# Patient Record
Sex: Female | Born: 1965 | Race: White | Hispanic: No | Smoking: Never smoker
Health system: Southern US, Community
[De-identification: ages and names within clinical notes are randomized; demographics above are authoritative.]

## PROBLEM LIST (undated history)

## (undated) DIAGNOSIS — E785 Hyperlipidemia, unspecified: Secondary | ICD-10-CM

## (undated) DIAGNOSIS — I5020 Unspecified systolic (congestive) heart failure: Secondary | ICD-10-CM

## (undated) DIAGNOSIS — I119 Hypertensive heart disease without heart failure: Secondary | ICD-10-CM

## (undated) DIAGNOSIS — I428 Other cardiomyopathies: Secondary | ICD-10-CM

## (undated) HISTORY — DX: Unspecified systolic (congestive) heart failure: I50.20

## (undated) HISTORY — DX: Hypertensive heart disease without heart failure: I11.9

## (undated) HISTORY — DX: Hyperlipidemia, unspecified: E78.5

## (undated) HISTORY — DX: Other cardiomyopathies: I42.8

---

## 2006-05-16 ENCOUNTER — Ambulatory Visit: Payer: Self-pay | Admitting: Obstetrics and Gynecology

## 2007-09-11 ENCOUNTER — Ambulatory Visit: Payer: Self-pay | Admitting: Obstetrics and Gynecology

## 2019-02-19 ENCOUNTER — Emergency Department: Payer: BC Managed Care – PPO

## 2019-02-19 ENCOUNTER — Emergency Department
Admission: EM | Admit: 2019-02-19 | Discharge: 2019-02-19 | Disposition: A | Discharge status: Home / Self Care | Payer: BC Managed Care – PPO | Attending: Emergency Medicine | Admitting: Emergency Medicine

## 2019-02-19 ENCOUNTER — Encounter: Payer: Self-pay | Admitting: Emergency Medicine

## 2019-02-19 DIAGNOSIS — Z20828 Contact with and (suspected) exposure to other viral communicable diseases: Secondary | ICD-10-CM | POA: Diagnosis not present

## 2019-02-19 DIAGNOSIS — R030 Elevated blood-pressure reading, without diagnosis of hypertension: Secondary | ICD-10-CM | POA: Diagnosis not present

## 2019-02-19 DIAGNOSIS — R52 Pain, unspecified: Secondary | ICD-10-CM

## 2019-02-19 DIAGNOSIS — R609 Edema, unspecified: Secondary | ICD-10-CM

## 2019-02-19 DIAGNOSIS — I10 Essential (primary) hypertension: Secondary | ICD-10-CM | POA: Diagnosis not present

## 2019-02-19 DIAGNOSIS — I161 Hypertensive emergency: Secondary | ICD-10-CM | POA: Diagnosis not present

## 2019-02-19 DIAGNOSIS — R600 Localized edema: Secondary | ICD-10-CM | POA: Insufficient documentation

## 2019-02-19 LAB — BASIC METABOLIC PANEL
Anion gap: 14 (ref 5–15)
BUN: 19 mg/dL (ref 6–20)
CO2: 22 mmol/L (ref 22–32)
Calcium: 9.5 mg/dL (ref 8.9–10.3)
Chloride: 99 mmol/L (ref 98–111)
Creatinine, Ser: 1.1 mg/dL — ABNORMAL HIGH (ref 0.44–1.00)
GFR calc Af Amer: >60 mL/min (ref >60)
GFR calc non Af Amer: 58 mL/min — ABNORMAL LOW (ref >60)
Glucose, Bld: 99 mg/dL (ref 70–99)
Potassium: 3.1 mmol/L — ABNORMAL LOW (ref 3.5–5.1)
Sodium: 135 mmol/L (ref 135–145)

## 2019-02-19 LAB — CBC
HCT: 39.9 % (ref 36.0–46.0)
Hemoglobin: 11.7 g/dL — ABNORMAL LOW (ref 12.0–15.0)
MCH: 21.9 pg — ABNORMAL LOW (ref 26.0–34.0)
MCHC: 29.3 g/dL — ABNORMAL LOW (ref 30.0–36.0)
MCV: 74.6 fL — ABNORMAL LOW (ref 80.0–100.0)
Platelets: 380 10*3/uL (ref 150–400)
RBC: 5.35 MIL/uL — ABNORMAL HIGH (ref 3.87–5.11)
RDW: 18.7 % — ABNORMAL HIGH (ref 11.5–15.5)
WBC: 9.7 10*3/uL (ref 4.0–10.5)
nRBC: 0 % (ref 0.0–0.2)

## 2019-02-19 LAB — TROPONIN I (HIGH SENSITIVITY)
Troponin I (High Sensitivity): 26 ng/L — ABNORMAL HIGH (ref <18)
Troponin I (High Sensitivity): 25 ng/L — ABNORMAL HIGH (ref <18)

## 2019-02-19 MED ORDER — LORAZEPAM 1 MG PO TABS
1.0000 mg | ORAL_TABLET | Freq: Once | ORAL | Status: AC
  Administered 2019-02-19: 1 mg via ORAL
  Filled 2019-02-19: qty 1

## 2019-02-19 MED ORDER — AMLODIPINE BESYLATE 10 MG PO TABS: 10.0000 mg | ORAL_TABLET | Freq: Every day | ORAL | 2 refills | Status: DC

## 2019-02-19 MED ORDER — LABETALOL HCL 5 MG/ML IV SOLN
10.0000 mg | Freq: Once | INTRAVENOUS | Status: AC
  Administered 2019-02-19: 10 mg via INTRAVENOUS
  Filled 2019-02-19: qty 4

## 2019-02-19 MED ORDER — FUROSEMIDE 20 MG PO TABS: 20.0000 mg | ORAL_TABLET | Freq: Every day | ORAL | 0 refills | Status: DC

## 2019-02-19 MED ORDER — CLONIDINE HCL 0.1 MG PO TABS
0.1000 mg | ORAL_TABLET | Freq: Once | ORAL | Status: AC
  Administered 2019-02-19: 0.1 mg via ORAL
  Filled 2019-02-19: qty 1

## 2019-02-19 NOTE — ED Provider Notes (Signed)
[facility] Emergency Department Provider Note  Time seen: 10:23 AM  I have reviewed the triage vital signs and the nursing notes.   HISTORY  Chief Complaint Hypertension   HPI Rebecca Bennett is a 53 y.o. female with no past medical history takes no medications presents to the emergency department for hypertension.  According to the patient she was seen in urgent care today for a Covid test.  States her father tested positive over the weekend and they were around him recently.  Patient denies any symptoms denies any cough congestion fever headache.  However while the patient was at urgent care getting her Covid swab they did vitals and noted the patient be quite hypertensive and sent the patient to the emergency department.  Here the patient is hypertensive 209/143.  More concerning the patient has lower extremity edema which she states has been occurring over the past 1 to 2 weeks but has never had lower extremity murmur previously per patient.  States minimal shortness of breath with exertion but denies any increase over the past couple weeks.  Denies any chest pain at any point.   History reviewed. No pertinent past medical history.  There are no active problems to display for this patient.   History reviewed. No pertinent surgical history.  Prior to Admission medications   Not on File    No Known Allergies  No family history on file.  Social History Social History   Tobacco Use  . Smoking status: Never Smoker  Substance Use Topics  . Alcohol use: Not Currently  . Drug use: Not on file    Review of Systems Constitutional: Negative for fever. Cardiovascular: Negative for chest pain. Respiratory: Mild shortness of breath with exertion, none at rest. Gastrointestinal: Negative for abdominal pain, vomiting and diarrhea. Genitourinary: Negative for urinary compaints Musculoskeletal: Lower extreme edema x2 weeks. Skin: Negative for skin  complaints  Neurological: Negative for headache All other ROS negative  ____________________________________________   PHYSICAL EXAM:  VITAL SIGNS: ED Triage Vitals [02/19/19 0955]  Enc Vitals Group     BP (!) 209/143     Pulse Rate (!) 120     Resp 18     Temp 98 F (36.7 C)     Temp Source Oral     SpO2 100 %     Weight 184 lb (83.5 kg)     Height 5\' 2"  (1.575 m)     Head Circumference      Peak Flow      Pain Score 0     Pain Loc      Pain Edu?      Excl. in GC?    Constitutional: Alert and oriented. Well appearing and in no distress. Eyes: Normal exam ENT      Head: Normocephalic and atraumatic      Mouth/Throat: Mucous membranes are moist. Cardiovascular: Regular rhythm rate around 120.  No murmur. Respiratory: Normal respiratory effort without tachypnea nor retractions. Breath sounds are clear  Gastrointestinal: Soft and nontender. No distention.  Musculoskeletal: Nontender with normal range of motion in all extremities.  2+ lower extreme edema bilaterally.  Nontender.  Pitting. Neurologic:  Normal speech and language. No gross focal neurologic deficits  Skin:  Skin is warm, dry and intact.    ____________________________________________    EKG  EKG viewed and interpreted by myself shows sinus tachycardia at 127 with a narrow QRS, left axis deviation, largely normal intervals, nonspecific ST changes.  ____________________________________________  RADIOLOGY  Chest x-ray is negative for acute abnormality, besides mild cardiomegaly.  ____________________________________________   INITIAL IMPRESSION / ASSESSMENT AND PLAN / ED COURSE  Pertinent labs & imaging results that were available during my care of the patient were reviewed by me and considered in my medical decision making (see chart for details).   Patient presents to the emergency department for hypertension referred from urgent care.  Patient does have 2+ lower extremity edema to bilateral  lower extremities which is new x2 weeks per patient.  Denies any chest pain but does state some mild shortness of breath with exertion.  Patient is quite hypertensive, differential would include hypertensive urgency/emergency, increased afterload leading to peripheral edema, ACS or CHF.  We will check labs including cardiac enzymes, chest x-ray and continue to closely monitor.  We will dose clonidine 0.1 mg in the emergency department and monitor for improvement.  Patient is tachycardic but states she feels very anxious/nervous currently.  Patient's repeat troponin is largely unchanged.  Patient's blood pressure has responded very well to medication currently 135/95.  We will discharge patient on a short course of Lasix, we will start the patient on antihypertensive medications.  We will have the patient follow-up with cardiology tomorrow to arrange an appointment for an echocardiogram.  I discussed very strict return precautions as well as home monitoring of her blood pressure.  Patient is agreeable to this plan of care.  Rebecca Bennett was evaluated in Emergency Department on 02/19/2019 for the symptoms described in the history of present illness. She was evaluated in the context of the global COVID-19 pandemic, which necessitated consideration that the patient might be at risk for infection with the SARS-CoV-2 virus that causes COVID-19. Institutional protocols and algorithms that pertain to the evaluation of patients at risk for COVID-19 are in a state of rapid change based on information released by regulatory bodies including the CDC and federal and state organizations. These policies and algorithms were followed during the patient's care in the ED.  ____________________________________________   FINAL CLINICAL IMPRESSION(S) / ED DIAGNOSES  Hypertension Peripheral edema   Harvest Dark, MD 02/19/19 1420

## 2019-02-19 NOTE — ED Notes (Signed)
EDP at bedside  

## 2019-02-19 NOTE — ED Triage Notes (Addendum)
Reports she went to [facility] for asymptomatic covid screening-her father dx. She was told BP high and referred to ER. Does not take medications, denies CP or SOB. States BP has been high for a few years-does not have PCP Bilateral foot and leg swelling X 3-4 days.  Pt alert and oriented X4, cooperative, RR even and unlabored, color WNL. Pt in NAD.

## 2019-02-24 ENCOUNTER — Other Ambulatory Visit: Payer: Self-pay

## 2019-02-24 DIAGNOSIS — Z20822 Contact with and (suspected) exposure to covid-19: Secondary | ICD-10-CM

## 2019-02-26 LAB — NOVEL CORONAVIRUS, NAA: SARS-CoV-2, NAA: NOT DETECTED

## 2019-03-24 ENCOUNTER — Encounter: Payer: Self-pay | Admitting: Internal Medicine

## 2019-03-24 ENCOUNTER — Ambulatory Visit (INDEPENDENT_AMBULATORY_CARE_PROVIDER_SITE_OTHER): Payer: BC Managed Care – PPO | Admitting: Internal Medicine

## 2019-03-24 ENCOUNTER — Other Ambulatory Visit: Payer: Self-pay

## 2019-03-24 VITALS — BP 140/82 | HR 123 | Temp 97.1°F | Ht 61.0 in | Wt 171.0 lb

## 2019-03-24 DIAGNOSIS — I10 Essential (primary) hypertension: Secondary | ICD-10-CM | POA: Diagnosis not present

## 2019-03-24 DIAGNOSIS — E7800 Pure hypercholesterolemia, unspecified: Secondary | ICD-10-CM | POA: Diagnosis not present

## 2019-03-24 DIAGNOSIS — R609 Edema, unspecified: Secondary | ICD-10-CM | POA: Diagnosis not present

## 2019-03-24 DIAGNOSIS — Z13220 Encounter for screening for lipoid disorders: Secondary | ICD-10-CM | POA: Diagnosis not present

## 2019-03-24 LAB — COMPREHENSIVE METABOLIC PANEL
ALT: 13 U/L (ref 0–35)
AST: 14 U/L (ref 0–37)
Albumin: 4.5 g/dL (ref 3.5–5.2)
Alkaline Phosphatase: 126 U/L — ABNORMAL HIGH (ref 39–117)
BUN: 7 mg/dL (ref 6–23)
CO2: 27 mEq/L (ref 19–32)
Calcium: 9.7 mg/dL (ref 8.4–10.5)
Chloride: 102 mEq/L (ref 96–112)
Creatinine, Ser: 0.75 mg/dL (ref 0.40–1.20)
GFR: 81.1 mL/min (ref >60.00)
Glucose, Bld: 103 mg/dL — ABNORMAL HIGH (ref 70–99)
Potassium: 4 mEq/L (ref 3.5–5.1)
Sodium: 138 mEq/L (ref 135–145)
Total Bilirubin: 1 mg/dL (ref 0.2–1.2)
Total Protein: 6.9 g/dL (ref 6.0–8.3)

## 2019-03-24 LAB — LIPID PANEL
Cholesterol: 209 mg/dL — ABNORMAL HIGH (ref 0–200)
HDL: 33.7 mg/dL — ABNORMAL LOW (ref >39.00)
LDL Cholesterol: 151 mg/dL — ABNORMAL HIGH (ref 0–99)
NonHDL: 175.78
Total CHOL/HDL Ratio: 6
Triglycerides: 126 mg/dL (ref 0.0–149.0)
VLDL: 25.2 mg/dL (ref 0.0–40.0)

## 2019-03-24 LAB — CBC
HCT: 37.8 % (ref 36.0–46.0)
Hemoglobin: 11.6 g/dL — ABNORMAL LOW (ref 12.0–15.0)
MCHC: 30.7 g/dL (ref 30.0–36.0)
MCV: 74.3 fl — ABNORMAL LOW (ref 78.0–100.0)
Platelets: 327 10*3/uL (ref 150.0–400.0)
RBC: 5.09 Mil/uL (ref 3.87–5.11)
RDW: 23.4 % — ABNORMAL HIGH (ref 11.5–15.5)
WBC: 7.2 10*3/uL (ref 4.0–10.5)

## 2019-03-24 LAB — BRAIN NATRIURETIC PEPTIDE: Pro B Natriuretic peptide (BNP): 202 pg/mL — ABNORMAL HIGH (ref 0.0–100.0)

## 2019-03-24 MED ORDER — FUROSEMIDE 20 MG PO TABS: 20.0000 mg | ORAL_TABLET | Freq: Every day | ORAL | 1 refills | Status: DC

## 2019-03-24 NOTE — Progress Notes (Signed)
HPI  Pt presents to the clinic today to establish care and for management of the conditions listed below. She has not had a PCP in a while but does follow with GYN.  HTN: Her BP today is 140/82. She is taking Amlodipine as prescribed, and having some swelling in both her feet. ECG from 01/2019 reviewed.  Flu: never Tetanus: > 10 years ago Pap Smear: > 5 years ago Mammogram: > 5 years ago Colon Screening: never Vision Screening: as needed Dentist: as needed  No past medical history on file.  Current Outpatient Medications  Medication Sig Dispense Refill  . acetaminophen (TYLENOL) 500 MG tablet Take 500 mg by mouth every 6 (six) hours as needed.    Marland Kitchen amLODipine (NORVASC) 10 MG tablet Take 1 tablet (10 mg total) by mouth daily. 30 tablet 2  . furosemide (LASIX) 20 MG tablet Take 1 tablet (20 mg total) by mouth daily for 5 days. (Patient not taking: Reported on 03/24/2019) 5 tablet 0   No current facility-administered medications for this visit.    No Known Allergies  Family History  Problem Relation Age of Onset  . Hypertension Mother   . Breast cancer Mother   . Hyperlipidemia Mother   . Prostate cancer Father   . Hypertension Maternal Grandmother   . Breast cancer Maternal Grandmother   . Hyperlipidemia Maternal Grandmother     Social History   Socioeconomic History  . Marital status: Widowed    Spouse name: Not on file  . Number of children: Not on file  . Years of education: Not on file  . Highest education level: Not on file  Occupational History  . Not on file  Tobacco Use  . Smoking status: Never Smoker  . Smokeless tobacco: Never Used  Substance and Sexual Activity  . Alcohol use: Yes    Comment: occasional  . Drug use: Not on file  . Sexual activity: Not on file  Other Topics Concern  . Not on file  Social History Narrative  . Not on file   Social Determinants of Health   Financial Resource Strain:   . Difficulty of Paying Living Expenses: Not on  file  Food Insecurity:   . Worried About Programme researcher, broadcasting/film/video in the Last Year: Not on file  . Ran Out of Food in the Last Year: Not on file  Transportation Needs:   . Lack of Transportation (Medical): Not on file  . Lack of Transportation (Non-Medical): Not on file  Physical Activity:   . Days of Exercise per Week: Not on file  . Minutes of Exercise per Session: Not on file  Stress:   . Feeling of Stress : Not on file  Social Connections:   . Frequency of Communication with Friends and Family: Not on file  . Frequency of Social Gatherings with Friends and Family: Not on file  . Attends Religious Services: Not on file  . Active Member of Clubs or Organizations: Not on file  . Attends Banker Meetings: Not on file  . Marital Status: Not on file  Intimate Partner Violence:   . Fear of Current or Ex-Partner: Not on file  . Emotionally Abused: Not on file  . Physically Abused: Not on file  . Sexually Abused: Not on file    ROS:  Constitutional: Denies fever, malaise, fatigue, headache or abrupt weight changes.  HEENT: Denies eye pain, eye redness, ear pain, ringing in the ears, wax buildup, runny nose, nasal congestion, bloody  nose, or sore throat. Respiratory: Denies difficulty breathing, shortness of breath, cough or sputum production.   Cardiovascular: Pt reports swelling in feet. Denies chest pain, chest tightness, palpitations or swelling in the hands.  Gastrointestinal: Denies abdominal pain, bloating, constipation, diarrhea or blood in the stool.  GU: Denies frequency, urgency, pain with urination, blood in urine, odor or discharge. Musculoskeletal: Denies decrease in range of motion, difficulty with gait, muscle pain or joint pain and swelling.  Skin: Denies redness, rashes, lesions or ulcercations.  Neurological: Denies dizziness, difficulty with memory, difficulty with speech or problems with balance and coordination.  Psych: Pt reports intermittent anxiety.  Denies depression, SI/HI.  No other specific complaints in a complete review of systems (except as listed in HPI above).  PE:  BP 140/82   Pulse (!) 123   Temp (!) 97.1 F (36.2 C) (Temporal)   Ht 5\' 1"  (1.549 m)   Wt 171 lb (77.6 kg)   LMP 08/26/2018   SpO2 100%   BMI 32.31 kg/m   Wt Readings from Last 3 Encounters:  02/19/19 184 lb (83.5 kg)    General: Appears her stated age, obese, in NAD. HEENT: Head: normal shape and size; Eyes: sclera white, no icterus, conjunctiva pink, PERRLA and EOMs intact;  Neck: Neck supple, trachea midline. No masses, lumps or thyromegaly present.  Cardiovascular: Tachycardic with normal rhythm. S1,S2 noted.  No murmur, rubs or gallops noted. 1+ pitting BLE edema. No carotid bruits noted. Pulmonary/Chest: Normal effort and positive vesicular breath sounds. No respiratory distress. No wheezes, rales or ronchi noted.  Musculoskeletal: No difficulty with gait.  Neurological: Alert and oriented.  Psychiatric: Mood and affect normal. Behavior is normal. Judgment and thought content normal.     BMET    Component Value Date/Time   NA 135 02/19/2019 1004   K 3.1 (L) 02/19/2019 1004   CL 99 02/19/2019 1004   CO2 22 02/19/2019 1004   GLUCOSE 99 02/19/2019 1004   BUN 19 02/19/2019 1004   CREATININE 1.10 (H) 02/19/2019 1004   CALCIUM 9.5 02/19/2019 1004   GFRNONAA 58 (L) 02/19/2019 1004   GFRAA >60 02/19/2019 1004    Lipid Panel  No results found for: CHOL, TRIG, HDL, CHOLHDL, VLDL, LDLCALC  CBC    Component Value Date/Time   WBC 9.7 02/19/2019 1004   RBC 5.35 (H) 02/19/2019 1004   HGB 11.7 (L) 02/19/2019 1004   HCT 39.9 02/19/2019 1004   PLT 380 02/19/2019 1004   MCV 74.6 (L) 02/19/2019 1004   MCH 21.9 (L) 02/19/2019 1004   MCHC 29.3 (L) 02/19/2019 1004   RDW 18.7 (H) 02/19/2019 1004    Hgb A1C No results found for: HGBA1C   Assessment and Plan:  Peripheral Edema:  CMET and BNP today Discussed DASH diet Encouraged  elevation Consider stopping Amlodipine vs adding diuretic pending labs  Screen for HLD:  CMET and Lipid profile today Encouraged her to consume a low fat diet.  Tachycardia:  Consider adding beta blocker to decrease HR and for better BP control pending labs RTC in 2 weeks for BP followup Webb Silversmith, NP This visit occurred during the SARS-CoV-2 public health emergency.  Safety protocols were in place, including screening questions prior to the visit, additional usage of staff PPE, and extensive cleaning of exam room while observing appropriate contact time as indicated for disinfecting solutions.

## 2019-03-24 NOTE — Patient Instructions (Signed)

## 2019-03-24 NOTE — Addendum Note (Signed)
Addended by: Lurlean Nanny on: 03/24/2019 04:44 PM   Modules accepted: Orders

## 2019-03-24 NOTE — Assessment & Plan Note (Signed)
CMET today Continue Amlodipine for now Consider adding diuretic vs beta blocker pending labs Discussed DASH diet

## 2019-03-24 NOTE — Addendum Note (Signed)
Addended by: [name] on: 03/24/2019 03:28 PM   Modules accepted: Orders

## 2019-04-08 ENCOUNTER — Other Ambulatory Visit: Payer: Self-pay | Admitting: Internal Medicine

## 2019-04-08 DIAGNOSIS — R609 Edema, unspecified: Secondary | ICD-10-CM

## 2019-04-14 ENCOUNTER — Other Ambulatory Visit: Payer: Self-pay

## 2019-04-14 ENCOUNTER — Ambulatory Visit (INDEPENDENT_AMBULATORY_CARE_PROVIDER_SITE_OTHER): Payer: BC Managed Care – PPO

## 2019-04-14 DIAGNOSIS — R609 Edema, unspecified: Secondary | ICD-10-CM | POA: Diagnosis not present

## 2019-04-16 ENCOUNTER — Other Ambulatory Visit: Payer: Self-pay

## 2019-04-16 ENCOUNTER — Ambulatory Visit (INDEPENDENT_AMBULATORY_CARE_PROVIDER_SITE_OTHER): Payer: BC Managed Care – PPO | Admitting: Internal Medicine

## 2019-04-16 ENCOUNTER — Encounter: Payer: Self-pay | Admitting: Internal Medicine

## 2019-04-16 VITALS — BP 142/86 | HR 126 | Temp 97.2°F | Wt 168.0 lb

## 2019-04-16 DIAGNOSIS — I5022 Chronic systolic (congestive) heart failure: Secondary | ICD-10-CM

## 2019-04-16 DIAGNOSIS — I10 Essential (primary) hypertension: Secondary | ICD-10-CM

## 2019-04-16 LAB — BASIC METABOLIC PANEL
BUN: 13 mg/dL (ref 6–23)
CO2: 28 mEq/L (ref 19–32)
Calcium: 9.9 mg/dL (ref 8.4–10.5)
Chloride: 100 mEq/L (ref 96–112)
Creatinine, Ser: 0.82 mg/dL (ref 0.40–1.20)
GFR: 73.15 mL/min (ref >60.00)
Glucose, Bld: 104 mg/dL — ABNORMAL HIGH (ref 70–99)
Potassium: 4.1 mEq/L (ref 3.5–5.1)
Sodium: 137 mEq/L (ref 135–145)

## 2019-04-16 LAB — BRAIN NATRIURETIC PEPTIDE: Pro B Natriuretic peptide (BNP): 110 pg/mL — ABNORMAL HIGH (ref 0.0–100.0)

## 2019-04-16 LAB — TSH: TSH: 1.96 u[IU]/mL (ref 0.35–4.50)

## 2019-04-16 MED ORDER — METOPROLOL SUCCINATE ER 25 MG PO TB24: 25.0000 mg | ORAL_TABLET | Freq: Every day | ORAL | 0 refills | Status: DC

## 2019-04-16 MED ORDER — LOSARTAN POTASSIUM 50 MG PO TABS: 50.0000 mg | ORAL_TABLET | Freq: Every day | ORAL | 0 refills | Status: DC

## 2019-04-16 NOTE — Assessment & Plan Note (Signed)
Continue Furosemide D/C Amlodipine RX for Losartan 50 mg PO daily RX for Metoprolol 25 mg PO daily Reinforced DASH diet, exercise for weight loss Referral to cardiology for further workup if needed

## 2019-04-16 NOTE — Assessment & Plan Note (Addendum)
Uncontrolled Continue Furosemide D/C Amlodipine RX for Losartan 50 mg PO daily RX for Metoprolol 25 mg PO daily Reinforced DASH diet, exercise for weight loss

## 2019-04-16 NOTE — Progress Notes (Signed)
Subjective:    Patient ID: Rebecca Bennett, female    DOB: 1965/12/15, 54 y.o.   MRN: [id number]  HPI  Pt presents to the clinic today for follow up HTN and to review labs. She had a recent ER visit (02/19/19) for HTN and peripheral edema. She was given a short course of Lasix and started on Amlodipine. At her ER followup (03/24/19) BNP was slightly elevated at 202. Echo was ordered which showed a LV EF of 30-35%, trivial pericardial effusion, mitral and tricuspid valve regurgitation. Her BP today is 142/86, HR 126. She denies history of smoking or drug use. She has a family history of HTN, HLD and some MI's but no one has a history of CHF.  Review of Systems      No past medical history on file.  Current Outpatient Medications  Medication Sig Dispense Refill  . acetaminophen (TYLENOL) 500 MG tablet Take 500 mg by mouth every 6 (six) hours as needed.    Marland Kitchen amLODipine (NORVASC) 10 MG tablet Take 1 tablet (10 mg total) by mouth daily. 30 tablet 2  . furosemide (LASIX) 20 MG tablet Take 1 tablet (20 mg total) by mouth daily. 30 tablet 1   No current facility-administered medications for this visit.    No Known Allergies  Family History  Problem Relation Age of Onset  . Hypertension Mother   . Breast cancer Mother   . Hyperlipidemia Mother   . Prostate cancer Father   . Hypertension Maternal Grandmother   . Breast cancer Maternal Grandmother   . Hyperlipidemia Maternal Grandmother     Social History   Socioeconomic History  . Marital status: Widowed    Spouse name: Not on file  . Number of children: Not on file  . Years of education: Not on file  . Highest education level: Not on file  Occupational History  . Not on file  Tobacco Use  . Smoking status: Never Smoker  . Smokeless tobacco: Never Used  Substance and Sexual Activity  . Alcohol use: Yes    Comment: occasional  . Drug use: Not on file  . Sexual activity: Not on file  Other Topics Concern  . Not on  file  Social History Narrative  . Not on file   Social Determinants of Health   Financial Resource Strain:   . Difficulty of Paying Living Expenses: Not on file  Food Insecurity:   . Worried About Programme researcher, broadcasting/film/video in the Last Year: Not on file  . Ran Out of Food in the Last Year: Not on file  Transportation Needs:   . Lack of Transportation (Medical): Not on file  . Lack of Transportation (Non-Medical): Not on file  Physical Activity:   . Days of Exercise per Week: Not on file  . Minutes of Exercise per Session: Not on file  Stress:   . Feeling of Stress : Not on file  Social Connections:   . Frequency of Communication with Friends and Family: Not on file  . Frequency of Social Gatherings with Friends and Family: Not on file  . Attends Religious Services: Not on file  . Active Member of Clubs or Organizations: Not on file  . Attends Banker Meetings: Not on file  . Marital Status: Not on file  Intimate Partner Violence:   . Fear of Current or Ex-Partner: Not on file  . Emotionally Abused: Not on file  . Physically Abused: Not on file  . Sexually  Abused: Not on file     Constitutional: Denies fever, malaise, fatigue, headache or abrupt weight changes.  Respiratory: Denies difficulty breathing, shortness of breath, cough or sputum production.   Cardiovascular: Pt reports swelling in BLE. Denies chest pain, chest tightness, palpitations or swelling in the hands.  Neurological: Denies dizziness, difficulty with memory, difficulty with speech or problems with balance and coordination.    No other specific complaints in a complete review of systems (except as listed in HPI above).  Objective:   Physical Exam  BP (!) 142/86   Pulse (!) 126   Temp (!) 97.2 F (36.2 C) (Temporal)   Wt 168 lb (76.2 kg)   LMP 08/26/2018   SpO2 98%   BMI 31.74 kg/m   Wt Readings from Last 3 Encounters:  03/24/19 171 lb (77.6 kg)  02/19/19 184 lb (83.5 kg)    General:  Appears her stated age, obese, in NAD. Cardiovascular: Tachycardic with normal rhythm. S1,S2 noted.  No murmur, rubs or gallops noted. Trace BLE edema.  Pulmonary/Chest: Normal effort and positive vesicular breath sounds. No respiratory distress. No wheezes, rales or ronchi noted.  Neurological: Alert and oriented.    BMET    Component Value Date/Time   NA 138 03/24/2019 0845   K 4.0 03/24/2019 0845   CL 102 03/24/2019 0845   CO2 27 03/24/2019 0845   GLUCOSE 103 (H) 03/24/2019 0845   BUN 7 03/24/2019 0845   CREATININE 0.75 03/24/2019 0845   CALCIUM 9.7 03/24/2019 0845   GFRNONAA 58 (L) 02/19/2019 1004   GFRAA >60 02/19/2019 1004    Lipid Panel     Component Value Date/Time   CHOL 209 (H) 03/24/2019 0845   TRIG 126.0 03/24/2019 0845   HDL 33.70 (L) 03/24/2019 0845   CHOLHDL 6 03/24/2019 0845   VLDL 25.2 03/24/2019 0845   LDLCALC 151 (H) 03/24/2019 0845    CBC    Component Value Date/Time   WBC 7.2 03/24/2019 0845   RBC 5.09 03/24/2019 0845   HGB 11.6 (L) 03/24/2019 0845   HCT 37.8 03/24/2019 0845   PLT 327.0 03/24/2019 0845   MCV 74.3 (L) 03/24/2019 0845   MCH 21.9 (L) 02/19/2019 1004   MCHC 30.7 03/24/2019 0845   RDW 23.4 (H) 03/24/2019 0845    Hgb A1C No results found for: HGBA1C      Assessment & Plan:

## 2019-04-16 NOTE — Patient Instructions (Signed)

## 2019-04-26 DIAGNOSIS — I420 Dilated cardiomyopathy: Secondary | ICD-10-CM | POA: Insufficient documentation

## 2019-04-26 NOTE — Progress Notes (Signed)
Cardiology Office Note  Date:  04/27/2019   ID:  Rebecca Bennett, DOB 1965/11/06, MRN [id number]  PCP:  Lorre Munroe, NP   Chief Complaint  Patient presents with  . OTHER    Discuss CHF. Meds reviewed verbally with pt.    HPI:  Rebecca Bennett is a 54 year old woman with past medical history of Hypertension Dilated cardiomyopathy Seen in the ER 02/19/2019, HTN, 209/143  2+ lower extremity edema to bilateral lower extremities since 01/2019\  minimal shortness of breath with exertion Chest x-ray is negative for acute abnormality, besides mild cardiomegaly. Started on clonidine patch Ejection fraction 30 to 35% on echocardiogram April 14, 2019 Who presents by referral from Nicki Reaper for consultation of her cardiomyopathy  Leg Swelling started 01/2019  may have had very slight abdominal bloating shortness of breath  Seen in the emergency room February 19, 2019 for hypertension Chest x-ray a mild to moderate cardiomegaly Had follow-up with primary care with medication changes Echocardiogram ordered  Echo 04/14/2019 1. Left ventricular ejection fraction, by visual estimation, is 30 to  35%. The left ventricle has severely decreased function. There is  moderately increased left ventricular hypertrophy.  2. Severe hypokinesis of the left ventricular, entire anteroseptal wall.  3. Indeterminate diastolic filling due to E-A fusion.  4. Mildly dilated left ventricular internal cavity size.  5. The left ventricle demonstrates global hypokinesis.  6. Global right ventricle has low normal systolic function.The right  ventricular size is moderately enlarged. No increase in right ventricular  wall thickness.  7. Left atrial size was mildly dilated.  Moderately elevated pulmonary artery systolic pressure.   On lasix 20 daily past 10 days feel better Less edema, less abdominal bloating, improved shortness of breath Overall feels back to her baseline Unable to check  blood pressure at home, difficulty getting the cuff on  Possible snoring, but no apnea No angina on exertion  Works in home health care  EKG personally reviewed by myself on todays visit NSR rate 89 no significant ST-T wave changes   PMH:   has no past medical history on file.  PSH:   History reviewed. No pertinent surgical history.  Current Outpatient Medications  Medication Sig Dispense Refill  . acetaminophen (TYLENOL) 500 MG tablet Take 500 mg by mouth every 6 (six) hours as needed.    . furosemide (LASIX) 20 MG tablet Take 1 tablet (20 mg total) by mouth daily. 30 tablet 1  . losartan (COZAAR) 50 MG tablet Take 1 tablet (50 mg total) by mouth daily. 30 tablet 0  . metoprolol succinate (TOPROL-XL) 25 MG 24 hr tablet Take 1 tablet (25 mg total) by mouth daily. 30 tablet 0   No current facility-administered medications for this visit.     Allergies:   Patient has no known allergies.   Social History:  The patient  reports that she has never smoked. She has never used smokeless tobacco. She reports current alcohol use. She reports that she does not use drugs.   Family History:   family history includes Breast cancer in her maternal grandmother and mother; Hyperlipidemia in her maternal grandmother and mother; Hypertension in her maternal grandmother and mother; Prostate cancer in her father.    Review of Systems: Review of Systems  Constitutional: Negative.   HENT: Negative.   Respiratory: Negative.   Cardiovascular: Negative.   Gastrointestinal: Negative.   Musculoskeletal: Negative.   Neurological: Negative.   Psychiatric/Behavioral: Negative.   All other systems reviewed and  are negative.    PHYSICAL EXAM: VS:  BP (!) 150/95 (BP Location: Right Arm, Patient Position: Sitting, Cuff Size: Normal)   Pulse 89   Ht 5\' 1"  (1.549 m)   Wt 169 lb 12 oz (77 kg)   LMP 08/26/2018   SpO2 98%   BMI 32.07 kg/m  , BMI Body mass index is 32.07 kg/m. GEN: Well nourished,  well developed, in no acute distress HEENT: normal Neck: no JVD, carotid bruits, or masses Cardiac: RRR; no murmurs, rubs, or gallops,no edema  Respiratory:  clear to auscultation bilaterally, normal work of breathing GI: soft, nontender, nondistended, + BS MS: no deformity or atrophy Skin: warm and dry, no rash Neuro:  Strength and sensation are intact Psych: euthymic mood, full affect  Recent Labs: 03/24/2019: ALT 13; Hemoglobin 11.6; Platelets 327.0 04/16/2019: BUN 13; Creatinine, Ser 0.82; Potassium 4.1; Pro B Natriuretic peptide (BNP) 110.0; Sodium 137; TSH 1.96    Lipid Panel Lab Results  Component Value Date   CHOL 209 (H) 03/24/2019   HDL 33.70 (L) 03/24/2019   LDLCALC 151 (H) 03/24/2019   TRIG 126.0 03/24/2019      Wt Readings from Last 3 Encounters:  04/27/19 169 lb 12 oz (77 kg)  04/16/19 168 lb (76.2 kg)  03/24/19 171 lb (77.6 kg)     ASSESSMENT AND PLAN:  Problem List Items Addressed This Visit      Cardiology Problems   Dilated cardiomyopathy ([place])   Essential hypertension   Chronic systolic congestive heart failure ([place]) - Primary     Dilated cardiomyopathy Presumed to be nonischemic, hypertensive heart disease with CHF  She is a non-smoker, no diabetes She does have cholesterol/hyperlipidemia & some family history Discussed various treatment options with her including cardiac catheterization versus stress testing to rule out ischemia She prefers stress testing at this time We have ordered treadmill Myoview -Blood pressure continues to run high on today's visit Recommend to increase losartan up to 100 daily with metoprolol succinate 50 daily Discussed ways for her to check her blood pressure at home and call us with some numbers -For now we will continue Lasix 20 daily, she has pending lab work in the next week or so  Essential hypertension Medication changes as above Discussed strategies with her blood pressure cuff for her to check it by  herself She has trouble getting a cuff on with Velcro  Chronic systolic CHF Likely hypertensive heart disease We will work aggressively on her blood pressure Ischemic rule out as above  Disposition:   F/U  2 months   Total encounter time more than 60 minutes  Greater than 50% was spent in counseling and coordination of care with the patient  Patient was seen in consultation for [facility] and will be referred back to her office for ongoing care of the issues detailed above    Signed, Esmond Plants, M.D., Ph.D. [place], [place]

## 2019-04-27 ENCOUNTER — Encounter: Payer: Self-pay | Admitting: Cardiovascular Disease

## 2019-04-27 ENCOUNTER — Other Ambulatory Visit: Payer: Self-pay

## 2019-04-27 ENCOUNTER — Ambulatory Visit (INDEPENDENT_AMBULATORY_CARE_PROVIDER_SITE_OTHER): Payer: BC Managed Care – PPO | Admitting: Cardiovascular Disease

## 2019-04-27 VITALS — BP 150/95 | HR 89 | Ht 61.0 in | Wt 169.8 lb

## 2019-04-27 DIAGNOSIS — I5022 Chronic systolic (congestive) heart failure: Secondary | ICD-10-CM

## 2019-04-27 DIAGNOSIS — I420 Dilated cardiomyopathy: Secondary | ICD-10-CM

## 2019-04-27 DIAGNOSIS — I10 Essential (primary) hypertension: Secondary | ICD-10-CM | POA: Diagnosis not present

## 2019-04-27 MED ORDER — METOPROLOL SUCCINATE ER 50 MG PO TB24: 50.0000 mg | ORAL_TABLET | Freq: Every day | ORAL | 3 refills | Status: DC

## 2019-04-27 MED ORDER — LOSARTAN POTASSIUM 100 MG PO TABS: 100.0000 mg | ORAL_TABLET | Freq: Every day | ORAL | 3 refills | Status: DC

## 2019-04-27 NOTE — Patient Instructions (Addendum)
Medication Instructions:  Your physician has recommended you make the following change in your medication:   1. INCREASE Losartan to 100 mg once daily 2. INCREASE Metoprolol succinate to 50 mg once daily    If you need a refill on your cardiac medications before your next appointment, please call your pharmacy.    Lab work: We will need to do a COVID swab prior to your stress test. Please make sure to go home and quarantine until your scheduled test.    If you have labs (blood work) drawn today and your tests are completely normal, you will receive your results only by: Marland Kitchen MyChart Message (if you have MyChart) OR . A paper copy in the mail If you have any lab test that is abnormal or we need to change your treatment, we will call you to review the results.   Testing/Procedures: Treadmill  myoview scan for cardiomyopathy   [facility] MYOVIEW  Your caregiver has ordered a Stress Test with nuclear imaging. The purpose of this test is to evaluate the blood supply to your heart muscle. This procedure is referred to as a "Non-Invasive Stress Test." This is because other than having an IV started in your vein, nothing is inserted or "invades" your body. Cardiac stress tests are done to find areas of poor blood flow to the heart by determining the extent of coronary artery disease (CAD). Some patients exercise on a treadmill, which naturally increases the blood flow to your heart, while others who are  unable to walk on a treadmill due to physical limitations have a pharmacologic/chemical stress agent called Lexiscan . This medicine will mimic walking on a treadmill by temporarily increasing your coronary blood flow.   Please note: these test may take anywhere between 2-4 hours to complete  PLEASE REPORT TO [facility] AT THE FIRST DESK WILL DIRECT YOU WHERE TO GO  Date of Procedure:_____________________________________  Arrival Time for  Procedure:______________________________  Instructions regarding medication:   _XX___:  Hold metoprolol the night before procedure and morning of procedure  _XX___:  Hold Furosemide the morning of your test.   PLEASE NOTIFY THE OFFICE AT LEAST 24 HOURS IN ADVANCE IF YOU ARE UNABLE TO KEEP YOUR APPOINTMENT.  [phone] AND  PLEASE NOTIFY NUCLEAR MEDICINE AT [facility] AT LEAST 24 HOURS IN ADVANCE IF YOU ARE UNABLE TO KEEP YOUR APPOINTMENT. [phone]  How to prepare for your Myoview test:  1. Do not eat or drink after midnight 2. No caffeine for 24 hours prior to test 3. No smoking 24 hours prior to test. 4. Your medication may be taken with water.  If your doctor stopped a medication because of this test, do not take that medication. 5. Ladies, please do not wear dresses.  Skirts or pants are appropriate. Please wear a short sleeve shirt. 6. No perfume, cologne or lotion. 7. Wear comfortable walking shoes. No heels!   Follow-Up: At [facility], you and your health needs are our priority.  As part of our continuing mission to provide you with exceptional heart care, we have created designated Provider Care Teams.  These Care Teams include your primary Cardiologist (physician) and Advanced Practice Providers (APPs -  Physician Assistants and Nurse Practitioners) who all work together to provide you with the care you need, when you need it.  . You will need a follow up appointment in 2 months   . Providers on your designated Care Team:   . Murray Hodgkins, NP .  Eula Listen, PA-C . Marisue Ivan, PA-C  Any Other Special Instructions Will Be Listed Below (If Applicable).  For educational health videos Log in to : [email or web address] Or : [email or web address], password : [email or web address]  Please make sure to take blood pressures 2 hours after your medications.  How to Take Your Blood Pressure You can take your blood pressure at home with a machine. You may need to check your blood pressure at  home:  To check if you have high blood pressure (hypertension).  To check your blood pressure over time.  To make sure your blood pressure medicine is working. Supplies needed: You will need a blood pressure machine, or monitor. You can buy one at a drugstore or online. When choosing one:  Choose one with an arm cuff.  Choose one that wraps around your upper arm. Only one finger should fit between your arm and the cuff.  Do not choose one that measures your blood pressure from your wrist or finger. Your doctor can suggest a monitor. How to prepare Avoid these things for 30 minutes before checking your blood pressure:  Drinking caffeine.  Drinking alcohol.  Eating.  Smoking.  Exercising. Five minutes before checking your blood pressure:  Pee.  Sit in a dining chair. Avoid sitting in a soft couch or armchair.  Be quiet. Do not talk. How to take your blood pressure Follow the instructions that came with your machine. If you have a digital blood pressure monitor, these may be the instructions: 1. Sit up straight. 2. Place your feet on the floor. Do not cross your ankles or legs. 3. Rest your left arm at the level of your heart. You may rest it on a table, desk, or chair. 4. Pull up your shirt sleeve. 5. Wrap the blood pressure cuff around the upper part of your left arm. The cuff should be 1 inch (2.5 cm) above your elbow. It is best to wrap the cuff around bare skin. 6. Fit the cuff snugly around your arm. You should be able to place only one finger between the cuff and your arm. 7. Put the cord inside the groove of your elbow. 8. Press the power button. 9. Sit quietly while the cuff fills with air and loses air. 10. Write down the numbers on the screen. 11. Wait 2-3 minutes and then repeat steps 1-10. What do the numbers mean? Two numbers make up your blood pressure. The first number is called systolic pressure. The second is called diastolic pressure. An example of a  blood pressure reading is "120 over 80" (or 120/80). If you are an adult and do not have a medical condition, use this guide to find out if your blood pressure is normal: Normal  First number: below 120.  Second number: below 80. Elevated  First number: 120-129.  Second number: below 80. Hypertension stage 1  First number: 130-139.  Second number: 80-89. Hypertension stage 2  First number: 140 or above.  Second number: 90 or above. Your blood pressure is above normal even if only the top or bottom number is above normal. Follow these instructions at home:  Check your blood pressure as often as your doctor tells you to.  Take your monitor to your next doctor's appointment. Your doctor will: ? Make sure you are using it correctly. ? Make sure it is working right.  Make sure you understand what your blood pressure numbers should be.  Tell your doctor if your medicines are causing side effects. Contact  a doctor if:  Your blood pressure keeps being high. Get help right away if:  Your first blood pressure number is higher than 180.  Your second blood pressure number is higher than 120. This information is not intended to replace advice given to you by your health care provider. Make sure you discuss any questions you have with your health care provider. Document Revised: 02/22/2017 Document Reviewed: 08/19/2015 Elsevier Patient Education  2020 [employer].

## 2019-05-05 ENCOUNTER — Other Ambulatory Visit (INDEPENDENT_AMBULATORY_CARE_PROVIDER_SITE_OTHER): Payer: BC Managed Care – PPO

## 2019-05-05 ENCOUNTER — Other Ambulatory Visit: Payer: Self-pay

## 2019-05-05 DIAGNOSIS — I10 Essential (primary) hypertension: Secondary | ICD-10-CM | POA: Diagnosis not present

## 2019-05-05 DIAGNOSIS — R609 Edema, unspecified: Secondary | ICD-10-CM | POA: Diagnosis not present

## 2019-05-05 DIAGNOSIS — E7800 Pure hypercholesterolemia, unspecified: Secondary | ICD-10-CM | POA: Diagnosis not present

## 2019-05-05 DIAGNOSIS — Z13220 Encounter for screening for lipoid disorders: Secondary | ICD-10-CM | POA: Diagnosis not present

## 2019-05-05 LAB — COMPREHENSIVE METABOLIC PANEL
ALT: 18 U/L (ref 0–35)
AST: 17 U/L (ref 0–37)
Albumin: 4.2 g/dL (ref 3.5–5.2)
Alkaline Phosphatase: 107 U/L (ref 39–117)
BUN: 14 mg/dL (ref 6–23)
CO2: 29 mEq/L (ref 19–32)
Calcium: 9.6 mg/dL (ref 8.4–10.5)
Chloride: 103 mEq/L (ref 96–112)
Creatinine, Ser: 0.9 mg/dL (ref 0.40–1.20)
GFR: 65.68 mL/min (ref >60.00)
Glucose, Bld: 101 mg/dL — ABNORMAL HIGH (ref 70–99)
Potassium: 4 mEq/L (ref 3.5–5.1)
Sodium: 138 mEq/L (ref 135–145)
Total Bilirubin: 1 mg/dL (ref 0.2–1.2)
Total Protein: 7.1 g/dL (ref 6.0–8.3)

## 2019-05-05 LAB — LIPID PANEL
Cholesterol: 242 mg/dL — ABNORMAL HIGH (ref 0–200)
HDL: 44.3 mg/dL (ref >39.00)
NonHDL: 197.39
Total CHOL/HDL Ratio: 5
Triglycerides: 211 mg/dL — ABNORMAL HIGH (ref 0.0–149.0)
VLDL: 42.2 mg/dL — ABNORMAL HIGH (ref 0.0–40.0)

## 2019-05-05 LAB — LDL CHOLESTEROL, DIRECT: Direct LDL: 170 mg/dL

## 2019-05-06 ENCOUNTER — Other Ambulatory Visit: Payer: Self-pay | Admitting: Cardiovascular Disease

## 2019-05-08 ENCOUNTER — Encounter: Payer: Self-pay | Admitting: Internal Medicine

## 2019-05-25 MED ORDER — FUROSEMIDE 20 MG PO TABS: 20.0000 mg | ORAL_TABLET | Freq: Every day | ORAL | 2 refills | Status: DC

## 2019-06-23 ENCOUNTER — Other Ambulatory Visit: Payer: BC Managed Care – PPO

## 2019-06-29 ENCOUNTER — Ambulatory Visit: Payer: BC Managed Care – PPO | Admitting: Cardiovascular Disease

## 2019-07-09 ENCOUNTER — Encounter
Admission: RE | Admit: 2019-07-09 | Discharge: 2019-07-09 | Disposition: A | Discharge status: Home / Self Care | Payer: BC Managed Care – PPO | Source: Ambulatory Visit | Attending: Cardiovascular Disease | Admitting: Cardiovascular Disease

## 2019-07-09 ENCOUNTER — Other Ambulatory Visit: Payer: Self-pay

## 2019-07-09 DIAGNOSIS — I420 Dilated cardiomyopathy: Secondary | ICD-10-CM

## 2019-07-09 LAB — NM MYOCAR MULTI W/SPECT W/WALL MOTION / EF
Estimated workload: 1 METS
Exercise duration (min): 0 min
Exercise duration (sec): 0 s
LV dias vol: 153 mL (ref 46–106)
LV sys vol: 65 mL
MPHR: 168 {beats}/min
Peak HR: 114 {beats}/min
Percent HR: 67 %
Rest HR: 76 {beats}/min
SDS: 10
SRS: 2
SSS: 4
TID: 0.98

## 2019-07-09 MED ORDER — TECHNETIUM TC 99M TETROFOSMIN IV KIT
31.0300 | PACK | Freq: Once | INTRAVENOUS | Status: AC | PRN
  Administered 2019-07-09: 31.03 via INTRAVENOUS

## 2019-07-09 MED ORDER — REGADENOSON 0.4 MG/5ML IV SOLN
0.4000 mg | Freq: Once | INTRAVENOUS | Status: AC
  Administered 2019-07-09: 0.4 mg via INTRAVENOUS

## 2019-07-09 MED ORDER — TECHNETIUM TC 99M TETROFOSMIN IV KIT
10.6700 | PACK | Freq: Once | INTRAVENOUS | Status: AC | PRN
  Administered 2019-07-09: 08:00:00 10.67 via INTRAVENOUS

## 2019-07-13 ENCOUNTER — Telehealth: Payer: Self-pay

## 2019-07-13 NOTE — Telephone Encounter (Signed)
Call attempted. No answer, vm box full.  

## 2019-07-13 NOTE — Progress Notes (Signed)
Cardiology Office Note  Date:  07/14/2019   ID:  Rebecca Bennett, DOB [birth year], MRN [id number]  PCP:  [name], NP   Chief Complaint  Patient presents with  . other    2 month follow up and discuss Myoview. Meds reviewed by the pt. verbally. "doing well."     HPI:  [name] is a 54 year old woman with past medical history of Hypertension Dilated cardiomyopathy Seen in the ER 02/19/2019, HTN, 209/143  2+ lower extremity edema to bilateral lower extremities since 01/2019\  minimal shortness of breath with exertion Chest x-ray is negative for acute abnormality, besides mild cardiomegaly. Started on clonidine patch Ejection fraction 30 to 35% on echocardiogram April 14, 2019 Presumed to be nonischemic, hypertensive heart disease with CHF  She is a non-smoker, no diabetes Who presents for follow-up of her cardiomyopathy  Stress test July 09, 2019, results discussed in detail Pharmacological myocardial perfusion imaging study with no significant  ischemia Normal wall motion, EF estimated at 51% Equivocal EKG changes , less than 1 mm at peak stress CT coronary calcium scoring with no significant aortic atherosclerosis or coronary calcification Baseline hypertension noted, systolic pressures 401U Low risk scan  Denies significant abdominal swelling, No leg edema Compliant with her Lasix losartan metoprolol Weight has trended up significantly in the past 2 months, has been eating more.  Does not feel this is fluid Heart rate has dramatically improved from January Previously in the 100s, in February was 80-90, now in the 60s  Lab work reviewed from 2 months ago stable electrolytes and kidney function  Lab work reviewed with her LDL 170 total cholesterol 240s She does not really want a cholesterol medication at this time  She has been measuring blood pressure at home ranges between 272Z up to 366 systolic  EKG personally reviewed by myself on todays  visit Shows normal sinus rhythm rate 61 bpm no significant ST-T wave changes   Other past medical history reviewed Leg Swelling started 01/2019  may have had very slight abdominal bloating shortness of breath  Seen in the emergency room February 19, 2019 for hypertension Chest x-ray a mild to moderate cardiomegaly Had follow-up with primary care with medication changes Echocardiogram ordered  Echo 04/14/2019 1. Left ventricular ejection fraction, by visual estimation, is 30 to  35%. The left ventricle has severely decreased function. There is  moderately increased left ventricular hypertrophy.  2. Severe hypokinesis of the left ventricular, entire anteroseptal wall.  3. Indeterminate diastolic filling due to E-A fusion.  4. Mildly dilated left ventricular internal cavity size.  5. The left ventricle demonstrates global hypokinesis.  6. Global right ventricle has low normal systolic function.The right  ventricular size is moderately enlarged. No increase in right ventricular  wall thickness.  7. Left atrial size was mildly dilated.  Moderately elevated pulmonary artery systolic pressure.    PMH:   has no past medical history on file.  PSH:   History reviewed. No pertinent surgical history.  Current Outpatient Medications  Medication Sig Dispense Refill  . acetaminophen (TYLENOL) 500 MG tablet Take 500 mg by mouth every 6 (six) hours as needed.    . furosemide (LASIX) 20 MG tablet Take 1 tablet (20 mg total) by mouth daily. 30 tablet 2  . losartan (COZAAR) 100 MG tablet Take 1 tablet (100 mg total) by mouth daily. 90 tablet 3  . metoprolol succinate (TOPROL-XL) 50 MG 24 hr tablet Take 1 tablet (50 mg total) by mouth  daily. Take with or immediately following a meal. 90 tablet 3   No current facility-administered medications for this visit.     Allergies:   Patient has no known allergies.   Social History:  The patient  reports that she has never smoked. She has never  used smokeless tobacco. She reports current alcohol use. She reports that she does not use drugs.   Family History:   family history includes Breast cancer in her maternal grandmother and mother; Hyperlipidemia in her maternal grandmother and mother; Hypertension in her maternal grandmother and mother; Prostate cancer in her father.    Review of Systems: Review of Systems  Constitutional: Negative.        Weight gain  HENT: Negative.   Respiratory: Negative.   Cardiovascular: Negative.   Gastrointestinal: Negative.   Musculoskeletal: Negative.   Neurological: Negative.   Psychiatric/Behavioral: Negative.   All other systems reviewed and are negative.   PHYSICAL EXAM: VS:  BP (!) 150/90 (BP Location: Left Arm, Patient Position: Sitting, Cuff Size: Normal)   Pulse 60   Ht 5\' 1"  (1.549 m)   Wt 187 lb 2 oz (84.9 kg)   LMP 08/26/2018   SpO2 99%   BMI 35.36 kg/m  , BMI Body mass index is 35.36 kg/m. GEN: Well nourished, well developed, in no acute distress HEENT: normal Neck: no JVD, carotid bruits, or masses Cardiac: RRR; no murmurs, rubs, or gallops,no edema  Respiratory:  clear to auscultation bilaterally, normal work of breathing GI: soft, nontender, nondistended, + BS MS: no deformity or atrophy Skin: warm and dry, no rash Neuro:  Strength and sensation are intact Psych: euthymic mood, full affect  Recent Labs: 03/24/2019: Hemoglobin 11.6; Platelets 327.0 04/16/2019: Pro B Natriuretic peptide (BNP) 110.0; TSH 1.96 05/05/2019: ALT 18; BUN 14; Creatinine, Ser 0.90; Potassium 4.0; Sodium 138    Lipid Panel Lab Results  Component Value Date   CHOL 242 (H) 05/05/2019   HDL 44.30 05/05/2019   LDLCALC 151 (H) 03/24/2019   TRIG 211.0 (H) 05/05/2019      Wt Readings from Last 3 Encounters:  07/14/19 187 lb 2 oz (84.9 kg)  04/27/19 169 lb 12 oz (77 kg)  04/16/19 168 lb (76.2 kg)     ASSESSMENT AND PLAN:  Problem List Items Addressed This Visit      Cardiology  Problems   Dilated cardiomyopathy ([facility]) - Primary   Relevant Orders   EKG 12-Lead   Essential hypertension   Chronic systolic congestive heart failure ([facility])   Relevant Orders   EKG 12-Lead     Dilated cardiomyopathy Discussed first treatment options, we will change the losartan to Entresto 49/51 mg p.o. twice daily Continue Lasix 20 with metoprolol succinate 50 CMP in 1 month.  She prefers to do this through primary care Recommend she call 04/18/19 with blood pressure measurements Might be able to tolerate higher dose Entresto, possibly spironolactone  Essential hypertension Recommend she continue to check blood pressure at home Seems to be doing better but blood pressure numbers still elevated She will call us with numbers after recent medication change  Chronic systolic CHF Likely hypertensive heart disease Stress test no ischemia Plan as above Appears relatively euvolemic Diet guide provided, weight is markedly elevated from eating more  Disposition:   F/U  3 months   Total encounter time more than 25 minutes  Greater than 50% was spent in counseling and coordination of care with the patient    Signed, [place], M.D., Ph.D.  [place], [place]

## 2019-07-13 NOTE — Telephone Encounter (Signed)
-----   Message from Antonieta Iba, MD sent at 07/12/2019 11:35 AM EDT ----- Stress test Normal cardiac function No perfusion abnormality concerning for blockages BP was elevated during test, would monitor BP and  call us if numbers still running high

## 2019-07-14 ENCOUNTER — Other Ambulatory Visit: Payer: Self-pay

## 2019-07-14 ENCOUNTER — Ambulatory Visit (INDEPENDENT_AMBULATORY_CARE_PROVIDER_SITE_OTHER): Payer: BC Managed Care – PPO | Admitting: Cardiovascular Disease

## 2019-07-14 ENCOUNTER — Encounter: Payer: Self-pay | Admitting: Cardiovascular Disease

## 2019-07-14 VITALS — BP 150/90 | HR 60 | Ht 61.0 in | Wt 187.1 lb

## 2019-07-14 DIAGNOSIS — I10 Essential (primary) hypertension: Secondary | ICD-10-CM

## 2019-07-14 DIAGNOSIS — I5022 Chronic systolic (congestive) heart failure: Secondary | ICD-10-CM | POA: Diagnosis not present

## 2019-07-14 DIAGNOSIS — I420 Dilated cardiomyopathy: Secondary | ICD-10-CM

## 2019-07-14 MED ORDER — ENTRESTO 49-51 MG PO TABS: 1.0000 | ORAL_TABLET | Freq: Two times a day (BID) | ORAL | 11 refills | Status: DC

## 2019-07-14 NOTE — Patient Instructions (Addendum)
Medication Instructions:  Your physician has recommended you make the following change in your medication:  1. STOP Losartan 2. START Entresto 49/51 mg twice a day  Please have CMP done at your next appointment with your primary care provider.   Your physician has requested that you regularly monitor and record your blood pressure readings at home. Please use the same machine at the same time of day to check your readings and record them to bring to your follow-up visit.  Please make sure to take blood pressures 2 hours after your medications. Please read additional information below. Keep a log of your readings as well.    If you need a refill on your cardiac medications before your next appointment, please call your pharmacy.    Lab work: No new labs needed   If you have labs (blood work) drawn today and your tests are completely normal, you will receive your results only by: Marland Kitchen MyChart Message (if you have MyChart) OR . A paper copy in the mail If you have any lab test that is abnormal or we need to change your treatment, we will call you to review the results.   Testing/Procedures: No new testing needed   Follow-Up: At [facility], you and your health needs are our priority.  As part of our continuing mission to provide you with exceptional heart care, we have created designated Provider Care Teams.  These Care Teams include your primary Cardiologist (physician) and Advanced Practice Providers (APPs -  Physician Assistants and Nurse Practitioners) who all work together to provide you with the care you need, when you need it.  . You will need a follow up appointment in 3 months   . Providers on your designated Care Team:   . Murray Hodgkins, NP . Christell Faith, PA-C . Marrianne Mood, PA-C  Any Other Special Instructions Will Be Listed Below (If Applicable).  For educational health videos Log in to : [email or web address] Or : [email or web address], password : [email or web address]   How to  Take Your Blood Pressure You can take your blood pressure at home with a machine. You may need to check your blood pressure at home:  To check if you have high blood pressure (hypertension).  To check your blood pressure over time.  To make sure your blood pressure medicine is working. Supplies needed: You will need a blood pressure machine, or monitor. You can buy one at a drugstore or online. When choosing one:  Choose one with an arm cuff.  Choose one that wraps around your upper arm. Only one finger should fit between your arm and the cuff.  Do not choose one that measures your blood pressure from your wrist or finger. Your doctor can suggest a monitor. How to prepare Avoid these things for 30 minutes before checking your blood pressure:  Drinking caffeine.  Drinking alcohol.  Eating.  Smoking.  Exercising. Five minutes before checking your blood pressure:  Pee.  Sit in a dining chair. Avoid sitting in a soft couch or armchair.  Be quiet. Do not talk. How to take your blood pressure Follow the instructions that came with your machine. If you have a digital blood pressure monitor, these may be the instructions: 1. Sit up straight. 2. Place your feet on the floor. Do not cross your ankles or legs. 3. Rest your left arm at the level of your heart. You may rest it on a table, desk, or chair. 4. Pull up your shirt sleeve. 5. Wrap the  blood pressure cuff around the upper part of your left arm. The cuff should be 1 inch (2.5 cm) above your elbow. It is best to wrap the cuff around bare skin. 6. Fit the cuff snugly around your arm. You should be able to place only one finger between the cuff and your arm. 7. Put the cord inside the groove of your elbow. 8. Press the power button. 9. Sit quietly while the cuff fills with air and loses air. 10. Write down the numbers on the screen. 11. Wait 2-3 minutes and then repeat steps 1-10. What do the numbers mean? Two numbers make  up your blood pressure. The first number is called systolic pressure. The second is called diastolic pressure. An example of a blood pressure reading is "120 over 80" (or 120/80). If you are an adult and do not have a medical condition, use this guide to find out if your blood pressure is normal: Normal  First number: below 120.  Second number: below 80. Elevated  First number: 120-129.  Second number: below 80. Hypertension stage 1  First number: 130-139.  Second number: 80-89. Hypertension stage 2  First number: 140 or above.  Second number: 90 or above. Your blood pressure is above normal even if only the top or bottom number is above normal. Follow these instructions at home:  Check your blood pressure as often as your doctor tells you to.  Take your monitor to your next doctor's appointment. Your doctor will: ? Make sure you are using it correctly. ? Make sure it is working right.  Make sure you understand what your blood pressure numbers should be.  Tell your doctor if your medicines are causing side effects. Contact a doctor if:  Your blood pressure keeps being high. Get help right away if:  Your first blood pressure number is higher than 180.  Your second blood pressure number is higher than 120. This information is not intended to replace advice given to you by your health care provider. Make sure you discuss any questions you have with your health care provider. Document Revised: 02/22/2017 Document Reviewed: 08/19/2015 Elsevier Patient Education  2020 [employer].

## 2019-07-16 NOTE — Progress Notes (Signed)
Results were discussed with patient at her appointment with provider.

## 2019-07-28 ENCOUNTER — Encounter: Payer: BC Managed Care – PPO | Admitting: Internal Medicine

## 2019-08-18 ENCOUNTER — Encounter: Payer: BC Managed Care – PPO | Admitting: Internal Medicine

## 2019-08-18 NOTE — Progress Notes (Deleted)
Subjective:    Patient ID: Rebecca Bennett, female    DOB: 09/13/1965, 54 y.o.   MRN: [id number]  HPI  Pt presents to the clinic today for her annual exam.  Flu: Tetanus: Covid: Pap Smear: Mammogram: Colon Screening: Vision Screening: Dentist:  Diet: Exercise:  Review of Systems   No past medical history on file.  Current Outpatient Medications  Medication Sig Dispense Refill  . acetaminophen (TYLENOL) 500 MG tablet Take 500 mg by mouth every 6 (six) hours as needed.    . furosemide (LASIX) 20 MG tablet Take 1 tablet (20 mg total) by mouth daily. 30 tablet 2  . metoprolol succinate (TOPROL-XL) 50 MG 24 hr tablet Take 1 tablet (50 mg total) by mouth daily. Take with or immediately following a meal. 90 tablet 3  . sacubitril-valsartan (ENTRESTO) 49-51 MG Take 1 tablet by mouth 2 (two) times daily. 60 tablet 11   No current facility-administered medications for this visit.    No Known Allergies  Family History  Problem Relation Age of Onset  . Hypertension Mother   . Breast cancer Mother   . Hyperlipidemia Mother   . Prostate cancer Father   . Hypertension Maternal Grandmother   . Breast cancer Maternal Grandmother   . Hyperlipidemia Maternal Grandmother     Social History   Socioeconomic History  . Marital status: Widowed    Spouse name: Not on file  . Number of children: Not on file  . Years of education: Not on file  . Highest education level: Not on file  Occupational History  . Not on file  Tobacco Use  . Smoking status: Never Smoker  . Smokeless tobacco: Never Used  Substance and Sexual Activity  . Alcohol use: Yes    Comment: occasional  . Drug use: Never  . Sexual activity: Not on file  Other Topics Concern  . Not on file  Social History Narrative  . Not on file   Social Determinants of Health   Financial Resource Strain:   . Difficulty of Paying Living Expenses:   Food Insecurity:   . Worried About Programme researcher, broadcasting/film/video in the  Last Year:   . Barista in the Last Year:   Transportation Needs:   . Freight forwarder (Medical):   Marland Kitchen Lack of Transportation (Non-Medical):   Physical Activity:   . Days of Exercise per Week:   . Minutes of Exercise per Session:   Stress:   . Feeling of Stress :   Social Connections:   . Frequency of Communication with Friends and Family:   . Frequency of Social Gatherings with Friends and Family:   . Attends Religious Services:   . Active Member of Clubs or Organizations:   . Attends Banker Meetings:   Marland Kitchen Marital Status:   Intimate Partner Violence:   . Fear of Current or Ex-Partner:   . Emotionally Abused:   Marland Kitchen Physically Abused:   . Sexually Abused:      Constitutional: Denies fever, malaise, fatigue, headache or abrupt weight changes.  HEENT: Denies eye pain, eye redness, ear pain, ringing in the ears, wax buildup, runny nose, nasal congestion, bloody nose, or sore throat. Respiratory: Denies difficulty breathing, shortness of breath, cough or sputum production.   Cardiovascular: Denies chest pain, chest tightness, palpitations or swelling in the hands or feet.  Gastrointestinal: Denies abdominal pain, bloating, constipation, diarrhea or blood in the stool.  GU: Denies urgency, frequency, pain with  urination, burning sensation, blood in urine, odor or discharge. Musculoskeletal: Denies decrease in range of motion, difficulty with gait, muscle pain or joint pain and swelling.  Skin: Denies redness, rashes, lesions or ulcercations.  Neurological: Denies dizziness, difficulty with memory, difficulty with speech or problems with balance and coordination.  Psych: Denies anxiety, depression, SI/HI.  No other specific complaints in a complete review of systems (except as listed in HPI above).  Objective:   Physical Exam    LMP 08/26/2018  Wt Readings from Last 3 Encounters:  07/14/19 187 lb 2 oz (84.9 kg)  04/27/19 169 lb 12 oz (77 kg)  04/16/19  168 lb (76.2 kg)    General: Appears their stated age, well developed, well nourished in NAD. Skin: Warm, dry and intact. No rashes, lesions or ulcerations noted. HEENT: Head: normal shape and size; Eyes: sclera white, no icterus, conjunctiva pink, PERRLA and EOMs intact; Ears: Tm's gray and intact, normal light reflex; Nose: mucosa pink and moist, septum midline; Throat/Mouth: Teeth present, mucosa pink and moist, no exudate, lesions or ulcerations noted.  Neck:  Neck supple, trachea midline. No masses, lumps or thyromegaly present.  Cardiovascular: Normal rate and rhythm. S1,S2 noted.  No murmur, rubs or gallops noted. No JVD or BLE edema. No carotid bruits noted. Pulmonary/Chest: Normal effort and positive vesicular breath sounds. No respiratory distress. No wheezes, rales or ronchi noted.  Abdomen: Soft and nontender. Normal bowel sounds. No distention or masses noted. Liver, spleen and kidneys non palpable. Musculoskeletal: Normal range of motion. No signs of joint swelling. No difficulty with gait.  Neurological: Alert and oriented. Cranial nerves II-XII grossly intact. Coordination normal.  Psychiatric: Mood and affect normal. Behavior is normal. Judgment and thought content normal.   EKG:  BMET    Component Value Date/Time   NA 138 05/05/2019 0756   K 4.0 05/05/2019 0756   CL 103 05/05/2019 0756   CO2 29 05/05/2019 0756   GLUCOSE 101 (H) 05/05/2019 0756   BUN 14 05/05/2019 0756   CREATININE 0.90 05/05/2019 0756   CALCIUM 9.6 05/05/2019 0756   GFRNONAA 58 (L) 02/19/2019 1004   GFRAA >60 02/19/2019 1004    Lipid Panel     Component Value Date/Time   CHOL 242 (H) 05/05/2019 0756   TRIG 211.0 (H) 05/05/2019 0756   HDL 44.30 05/05/2019 0756   CHOLHDL 5 05/05/2019 0756   VLDL 42.2 (H) 05/05/2019 0756   LDLCALC 151 (H) 03/24/2019 0845    CBC    Component Value Date/Time   WBC 7.2 03/24/2019 0845   RBC 5.09 03/24/2019 0845   HGB 11.6 (L) 03/24/2019 0845   HCT 37.8  03/24/2019 0845   PLT 327.0 03/24/2019 0845   MCV 74.3 (L) 03/24/2019 0845   MCH 21.9 (L) 02/19/2019 1004   MCHC 30.7 03/24/2019 0845   RDW 23.4 (H) 03/24/2019 0845    Hgb A1C No results found for: HGBA1C        Assessment & Plan:   Preventative Health Maintenance:  Encouraged her to get a flu shot in the fall Tetanus Covid Pap smear Mammogram Colon screening Encouraged her to consume a balanced diet and exercise regimen Advised her to see an eye doctor and dentist annually Will check CBC, CMET, Lipid and Vit D today  RTC in 1 year, sooner if needed Webb Silversmith, NP This visit occurred during the SARS-CoV-2 public health emergency.  Safety protocols were in place, including screening questions prior to the visit, additional usage of staff  PPE, and extensive cleaning of exam room while observing appropriate contact time as indicated for disinfecting solutions.

## 2019-08-31 ENCOUNTER — Other Ambulatory Visit: Payer: Self-pay | Admitting: Internal Medicine

## 2019-09-29 ENCOUNTER — Other Ambulatory Visit: Payer: Self-pay | Admitting: Internal Medicine

## 2019-10-01 ENCOUNTER — Encounter: Payer: Self-pay | Admitting: Internal Medicine

## 2019-10-05 ENCOUNTER — Other Ambulatory Visit: Payer: Self-pay

## 2019-10-06 ENCOUNTER — Ambulatory Visit (INDEPENDENT_AMBULATORY_CARE_PROVIDER_SITE_OTHER): Payer: BC Managed Care – PPO | Admitting: Internal Medicine

## 2019-10-06 ENCOUNTER — Encounter: Payer: Self-pay | Admitting: Internal Medicine

## 2019-10-06 VITALS — BP 142/86 | HR 81 | Temp 97.9°F | Ht 61.0 in | Wt 195.0 lb

## 2019-10-06 DIAGNOSIS — Z0000 Encounter for general adult medical examination without abnormal findings: Secondary | ICD-10-CM | POA: Diagnosis not present

## 2019-10-06 DIAGNOSIS — I10 Essential (primary) hypertension: Secondary | ICD-10-CM | POA: Diagnosis not present

## 2019-10-06 DIAGNOSIS — Z1231 Encounter for screening mammogram for malignant neoplasm of breast: Secondary | ICD-10-CM

## 2019-10-06 DIAGNOSIS — Z1211 Encounter for screening for malignant neoplasm of colon: Secondary | ICD-10-CM | POA: Diagnosis not present

## 2019-10-06 DIAGNOSIS — I5022 Chronic systolic (congestive) heart failure: Secondary | ICD-10-CM | POA: Diagnosis not present

## 2019-10-06 DIAGNOSIS — I420 Dilated cardiomyopathy: Secondary | ICD-10-CM

## 2019-10-06 DIAGNOSIS — Z23 Encounter for immunization: Secondary | ICD-10-CM | POA: Diagnosis not present

## 2019-10-06 LAB — COMPREHENSIVE METABOLIC PANEL
ALT: 20 U/L (ref 0–35)
AST: 16 U/L (ref 0–37)
Albumin: 4.4 g/dL (ref 3.5–5.2)
Alkaline Phosphatase: 77 U/L (ref 39–117)
BUN: 18 mg/dL (ref 6–23)
CO2: 29 mEq/L (ref 19–32)
Calcium: 9.7 mg/dL (ref 8.4–10.5)
Chloride: 97 mEq/L (ref 96–112)
Creatinine, Ser: 0.96 mg/dL (ref 0.40–1.20)
GFR: 60.87 mL/min (ref >60.00)
Glucose, Bld: 106 mg/dL — ABNORMAL HIGH (ref 70–99)
Potassium: 4.7 mEq/L (ref 3.5–5.1)
Sodium: 136 mEq/L (ref 135–145)
Total Bilirubin: 0.6 mg/dL (ref 0.2–1.2)
Total Protein: 7 g/dL (ref 6.0–8.3)

## 2019-10-06 LAB — LIPID PANEL
Cholesterol: 291 mg/dL — ABNORMAL HIGH (ref 0–200)
HDL: 46.8 mg/dL (ref >39.00)
NonHDL: 243.72
Total CHOL/HDL Ratio: 6
Triglycerides: 311 mg/dL — ABNORMAL HIGH (ref 0.0–149.0)
VLDL: 62.2 mg/dL — ABNORMAL HIGH (ref 0.0–40.0)

## 2019-10-06 LAB — CBC
HCT: 42.6 % (ref 36.0–46.0)
Hemoglobin: 14.7 g/dL (ref 12.0–15.0)
MCHC: 34.4 g/dL (ref 30.0–36.0)
MCV: 98.1 fl (ref 78.0–100.0)
Platelets: 267 10*3/uL (ref 150.0–400.0)
RBC: 4.35 Mil/uL (ref 3.87–5.11)
RDW: 14.1 % (ref 11.5–15.5)
WBC: 7.8 10*3/uL (ref 4.0–10.5)

## 2019-10-06 LAB — HEMOGLOBIN A1C: Hgb A1c MFr Bld: 5 % (ref 4.6–6.5)

## 2019-10-06 LAB — LDL CHOLESTEROL, DIRECT: Direct LDL: 189 mg/dL

## 2019-10-06 LAB — VITAMIN D 25 HYDROXY (VIT D DEFICIENCY, FRACTURES): VITD: 28.84 ng/mL — ABNORMAL LOW (ref 30.00–100.00)

## 2019-10-06 NOTE — Assessment & Plan Note (Signed)
Compensated C met today Reinforced DASH diet Monitor weights Continue Entresto, Furosemide and Metoprolol She will follow up with Dr. Rockey Situ next week

## 2019-10-06 NOTE — Patient Instructions (Signed)
Health Maintenance, Female Adopting a healthy lifestyle and getting preventive care are important in promoting health and wellness. Ask your health care provider about:  The right schedule for you to have regular tests and exams.  Things you can do on your own to prevent diseases and keep yourself healthy. What should I know about diet, weight, and exercise? Eat a healthy diet   Eat a diet that includes plenty of vegetables, fruits, low-fat dairy products, and lean protein.  Do not eat a lot of foods that are high in solid fats, added sugars, or sodium. Maintain a healthy weight Body mass index (BMI) is used to identify weight problems. It estimates body fat based on height and weight. Your health care provider can help determine your BMI and help you achieve or maintain a healthy weight. Get regular exercise Get regular exercise. This is one of the most important things you can do for your health. Most adults should:  Exercise for at least 150 minutes each week. The exercise should increase your heart rate and make you sweat (moderate-intensity exercise).  Do strengthening exercises at least twice a week. This is in addition to the moderate-intensity exercise.  Spend less time sitting. Even light physical activity can be beneficial. Watch cholesterol and blood lipids Have your blood tested for lipids and cholesterol at 53 years of age, then have this test every 5 years. Have your cholesterol levels checked more often if:  Your lipid or cholesterol levels are high.  You are older than 53 years of age.  You are at high risk for heart disease. What should I know about cancer screening? Depending on your health history and family history, you may need to have cancer screening at various ages. This may include screening for:  Breast cancer.  Cervical cancer.  Colorectal cancer.  Skin cancer.  Lung cancer. What should I know about heart disease, diabetes, and high blood  pressure? Blood pressure and heart disease  High blood pressure causes heart disease and increases the risk of stroke. This is more likely to develop in people who have high blood pressure readings, are of African descent, or are overweight.  Have your blood pressure checked: ? Every 3-5 years if you are 18-39 years of age. ? Every year if you are 40 years old or older. Diabetes Have regular diabetes screenings. This checks your fasting blood sugar level. Have the screening done:  Once every three years after age 40 if you are at a normal weight and have a low risk for diabetes.  More often and at a younger age if you are overweight or have a high risk for diabetes. What should I know about preventing infection? Hepatitis B If you have a higher risk for hepatitis B, you should be screened for this virus. Talk with your health care provider to find out if you are at risk for hepatitis B infection. Hepatitis C Testing is recommended for:  Everyone born from 1945 through 1965.  Anyone with known risk factors for hepatitis C. Sexually transmitted infections (STIs)  Get screened for STIs, including gonorrhea and chlamydia, if: ? You are sexually active and are younger than 53 years of age. ? You are older than 53 years of age and your health care provider tells you that you are at risk for this type of infection. ? Your sexual activity has changed since you were last screened, and you are at increased risk for chlamydia or gonorrhea. Ask your health care provider if   you are at risk.  Ask your health care provider about whether you are at high risk for HIV. Your health care provider may recommend a prescription medicine to help prevent HIV infection. If you choose to take medicine to prevent HIV, you should first get tested for HIV. You should then be tested every 3 months for as long as you are taking the medicine. Pregnancy  If you are about to stop having your period (premenopausal) and  you may become pregnant, seek counseling before you get pregnant.  Take 400 to 800 micrograms (mcg) of folic acid every day if you become pregnant.  Ask for birth control (contraception) if you want to prevent pregnancy. Osteoporosis and menopause Osteoporosis is a disease in which the bones lose minerals and strength with aging. This can result in bone fractures. If you are 65 years old or older, or if you are at risk for osteoporosis and fractures, ask your health care provider if you should:  Be screened for bone loss.  Take a calcium or vitamin D supplement to lower your risk of fractures.  Be given hormone replacement therapy (HRT) to treat symptoms of menopause. Follow these instructions at home: Lifestyle  Do not use any products that contain nicotine or tobacco, such as cigarettes, e-cigarettes, and chewing tobacco. If you need help quitting, ask your health care provider.  Do not use street drugs.  Do not share needles.  Ask your health care provider for help if you need support or information about quitting drugs. Alcohol use  Do not drink alcohol if: ? Your health care provider tells you not to drink. ? You are pregnant, may be pregnant, or are planning to become pregnant.  If you drink alcohol: ? Limit how much you use to 0-1 drink a day. ? Limit intake if you are breastfeeding.  Be aware of how much alcohol is in your drink. In the [place] one drink equals one 12 oz bottle of beer (355 mL), one 5 oz glass of wine (148 mL), or one 1 oz glass of hard liquor (44 mL). General instructions  Schedule regular health, dental, and eye exams.  Stay current with your vaccines.  Tell your health care provider if: ? You often feel depressed. ? You have ever been abused or do not feel safe at home. Summary  Adopting a healthy lifestyle and getting preventive care are important in promoting health and wellness.  Follow your health care provider's instructions about healthy  diet, exercising, and getting tested or screened for diseases.  Follow your health care provider's instructions on monitoring your cholesterol and blood pressure. This information is not intended to replace advice given to you by your health care provider. Make sure you discuss any questions you have with your health care provider. Document Revised: 03/05/2018 Document Reviewed: 03/05/2018 Elsevier Patient Education  2020 [employer]  

## 2019-10-06 NOTE — Assessment & Plan Note (Signed)
Continue Entresto, Furosemide and Metoprolol She will follow up with Dr. Mariah Milling next week

## 2019-10-06 NOTE — Progress Notes (Signed)
Subjective:    Patient ID: Rebecca Bennett, female    DOB: 06/06/1965, 54 y.o.   MRN: [id number]  HPI  Patient presents to the clinic today for her annual exam.  She is also due to follow-up chronic conditions.  HTN: Her BP today is 142/86.  She is taking Furosemide, Entresto and Metoprolol as prescribed.  ECG from 06/2018 reviewed.  She follows with Dr. Rockey Situ.  Dilated Cardiomyopathy with CHF: She denies lower extremity edema or shortness of breath.  She is taking Furosemide, Entresto and Metoprolol as prescribed.  Echo from 03/2019 reviewed.  She follows with Dr. Rockey Situ.  Flu: never Tetanus: > 10 years ago Covid: never Pap smear: > 5 years ago Mammogram: > 5 years ago Colon screening: never Vision screening: as screening Dentist: as screening  Diet: She does eat meat. She consumes more veggies than fruits. She tries to avoid fried foods. She drinks mostly water with Crystal Light. Exercise: None  Review of Systems      No past medical history on file.  Current Outpatient Medications  Medication Sig Dispense Refill  . acetaminophen (TYLENOL) 500 MG tablet Take 500 mg by mouth every 6 (six) hours as needed.    . furosemide (LASIX) 20 MG tablet TAKE 1 TABLET(20 MG) BY MOUTH DAILY 30 tablet 0  . metoprolol succinate (TOPROL-XL) 50 MG 24 hr tablet Take 1 tablet (50 mg total) by mouth daily. Take with or immediately following a meal. 90 tablet 3  . sacubitril-valsartan (ENTRESTO) 49-51 MG Take 1 tablet by mouth 2 (two) times daily. 60 tablet 11   No current facility-administered medications for this visit.    No Known Allergies  Family History  Problem Relation Age of Onset  . Hypertension Mother   . Breast cancer Mother   . Hyperlipidemia Mother   . Prostate cancer Father   . Hypertension Maternal Grandmother   . Breast cancer Maternal Grandmother   . Hyperlipidemia Maternal Grandmother     Social History   Socioeconomic History  . Marital status: Widowed      Spouse name: Not on file  . Number of children: Not on file  . Years of education: Not on file  . Highest education level: Not on file  Occupational History  . Not on file  Tobacco Use  . Smoking status: Never Smoker  . Smokeless tobacco: Never Used  Vaping Use  . Vaping Use: Never used  Substance and Sexual Activity  . Alcohol use: Yes    Comment: occasional  . Drug use: Never  . Sexual activity: Not on file  Other Topics Concern  . Not on file  Social History Narrative  . Not on file   Social Determinants of Health   Financial Resource Strain:   . Difficulty of Paying Living Expenses:   Food Insecurity:   . Worried About Charity fundraiser in the Last Year:   . Arboriculturist in the Last Year:   Transportation Needs:   . Film/video editor (Medical):   Marland Kitchen Lack of Transportation (Non-Medical):   Physical Activity:   . Days of Exercise per Week:   . Minutes of Exercise per Session:   Stress:   . Feeling of Stress :   Social Connections:   . Frequency of Communication with Friends and Family:   . Frequency of Social Gatherings with Friends and Family:   . Attends Religious Services:   . Active Member of Clubs or Organizations:   .  Attends Archivist Meetings:   Marland Kitchen Marital Status:   Intimate Partner Violence:   . Fear of Current or Ex-Partner:   . Emotionally Abused:   Marland Kitchen Physically Abused:   . Sexually Abused:      Constitutional: Denies fever, malaise, fatigue, headache or abrupt weight changes.  HEENT: Denies eye pain, eye redness, ear pain, ringing in the ears, wax buildup, runny nose, nasal congestion, bloody nose, or sore throat. Respiratory: Denies difficulty breathing, shortness of breath, cough or sputum production.   Cardiovascular: Denies chest pain, chest tightness, palpitations or swelling in the hands or feet.  Gastrointestinal: Denies abdominal pain, bloating, constipation, diarrhea or blood in the stool.  GU: Denies urgency,  frequency, pain with urination, burning sensation, blood in urine, odor or discharge. Musculoskeletal: Denies decrease in range of motion, difficulty with gait, muscle pain or joint pain and swelling.  Skin: Denies redness, rashes, lesions or ulcercations.  Neurological: Denies dizziness, difficulty with memory, difficulty with speech or problems with balance and coordination.  Psych: Denies anxiety, depression, SI/HI.  No other specific complaints in a complete review of systems (except as listed in HPI above).  Objective:   Physical Exam  BP (!) 142/86   Pulse 81   Temp 97.9 F (36.6 C) (Temporal)   Ht '5\' 1"'  (1.549 m)   Wt 195 lb (88.5 kg)   LMP 08/26/2018   SpO2 98%   BMI 36.84 kg/m   Wt Readings from Last 3 Encounters:  07/14/19 187 lb 2 oz (84.9 kg)  04/27/19 169 lb 12 oz (77 kg)  04/16/19 168 lb (76.2 kg)    General: Appears her stated age, obese, in NAD. Skin: Warm, dry and intact. No rashes, lesions or ulcerations noted. HEENT: Head: normal shape and size; Eyes: sclera white, no icterus, conjunctiva pink, PERRLA and EOMs intact; Ears: Tm's gray and intact, normal light reflex; Neck:  Neck supple, trachea midline. No masses, lumps or thyromegaly present.  Cardiovascular: Normal rate and rhythm. S1,S2 noted.  No murmur, rubs or gallops noted. No JVD or BLE edema. No carotid bruits noted. Pulmonary/Chest: Normal effort and positive vesicular breath sounds. No respiratory distress. No wheezes, rales or ronchi noted.  Abdomen: Soft and nontender. Normal bowel sounds. No distention or masses noted. Liver, spleen and kidneys non palpable. Musculoskeletal: Strength 5/5 BUE/BLE. No difficulty with gait.  Neurological: Alert and oriented. Cranial nerves II-XII grossly intact. Coordination normal.  Psychiatric: Mood and affect normal. Behavior is normal. Judgment and thought content normal.     BMET    Component Value Date/Time   NA 138 05/05/2019 0756   K 4.0 05/05/2019  0756   CL 103 05/05/2019 0756   CO2 29 05/05/2019 0756   GLUCOSE 101 (H) 05/05/2019 0756   BUN 14 05/05/2019 0756   CREATININE 0.90 05/05/2019 0756   CALCIUM 9.6 05/05/2019 0756   GFRNONAA 58 (L) 02/19/2019 1004   GFRAA >60 02/19/2019 1004    Lipid Panel     Component Value Date/Time   CHOL 242 (H) 05/05/2019 0756   TRIG 211.0 (H) 05/05/2019 0756   HDL 44.30 05/05/2019 0756   CHOLHDL 5 05/05/2019 0756   VLDL 42.2 (H) 05/05/2019 0756   LDLCALC 151 (H) 03/24/2019 0845    CBC    Component Value Date/Time   WBC 7.2 03/24/2019 0845   RBC 5.09 03/24/2019 0845   HGB 11.6 (L) 03/24/2019 0845   HCT 37.8 03/24/2019 0845   PLT 327.0 03/24/2019 0845   MCV  74.3 (L) 03/24/2019 0845   MCH 21.9 (L) 02/19/2019 1004   MCHC 30.7 03/24/2019 0845   RDW 23.4 (H) 03/24/2019 0845    Hgb A1C No results found for: HGBA1C         Assessment & Plan:   Preventative Health Maintenance:  Encouraged her to get a flu shot in the fall Tdap She will consider Covid vaccine Pap smear declined today Mammogram ordered- she will call [facility] to schedule Referral to GI for colon cancer screening Encouraged her to consume a balanced diet and exercise regimen advised her to see an eye doctor and dentist annually We will check CBC, C met, lipid, A1c and vitamin D today  RTC in 1 year, sooner if needed  Webb Silversmith, NP This visit occurred during the SARS-CoV-2 public health emergency.  Safety protocols were in place, including screening questions prior to the visit, additional usage of staff PPE, and extensive cleaning of exam room while observing appropriate contact time as indicated for disinfecting solutions.

## 2019-10-06 NOTE — Addendum Note (Signed)
Addended by: Roena Malady on: 10/06/2019 10:20 AM   Modules accepted: Orders

## 2019-10-06 NOTE — Assessment & Plan Note (Signed)
Stable on Entresto, Furosemide and Metoprolol She will follow up with Dr. Mariah Milling next week Reinforced DASH diet

## 2019-10-09 ENCOUNTER — Encounter: Payer: Self-pay | Admitting: Internal Medicine

## 2019-10-12 NOTE — Progress Notes (Signed)
Cardiology Office Note  Date:  10/13/2019   ID:  Rebecca Bennett, DOB [birth year], MRN [id number]  PCP:  Rebecca Munroe, NP   Chief Complaint  Patient presents with  . other    3 month follow up. Meds reviewed by the pt. verbally. "doing well."     HPI:  [name] is a 54 year old woman with past medical history of Hypertension Dilated cardiomyopathy Seen in the ER 02/19/2019, HTN, 209/143  2+ lower extremity edema to bilateral lower extremities since 01/2019  minimal shortness of breath with exertion Chest x-ray is negative for acute abnormality, besides mild cardiomegaly. Started on clonidine patch Ejection fraction 30 to 35% on echocardiogram April 14, 2019 Presumed to be nonischemic, hypertensive heart disease with CHF  She is a non-smoker, no diabetes Who presents for follow-up of her cardiomyopathy  Last seen in clinic by myself April 2021 Stress test performed April 2021, low risk study, no significant coronary calcification On that visit she was doing well, no leg swelling, no abdominal distention, Was taking Lasix losartan metoprolol Was eating more, weight was trending up Heart rate had improved, running in the 60-80 range Had stable electrolytes and renal function Elevated cholesterol was discussed, did not want cholesterol medication Blood sugar running 120 systolic  Entresto was increased up to 49/51 mg twice a day  Recent lab work reviewed LDL 189, total cholesterol 291, cholesterol medication was recommended by primary care, Lipitor was sent in Normal CMP Low vitamin D 28  Denies significant shortness of breath, No leg swelling, denies abdominal bloating abdominal distention from fluid retention Discussed blood pressure, range seems to be 120 up to 150 systolic No medication side effects No regular exercise program, working  EKG personally reviewed by myself on todays visit Normal sinus rhythm with rate 77 bpm no significant ST-T wave  changes  Other past medical history reviewed Leg Swelling started 01/2019  may have had very slight abdominal bloating shortness of breath  Seen in the emergency room February 19, 2019 for hypertension Chest x-ray a mild to moderate cardiomegaly Had follow-up with primary care with medication changes Echocardiogram ordered  Echo 04/14/2019 1. Left ventricular ejection fraction, by visual estimation, is 30 to  35%. The left ventricle has severely decreased function. There is  moderately increased left ventricular hypertrophy.  2. Severe hypokinesis of the left ventricular, entire anteroseptal wall.  3. Indeterminate diastolic filling due to E-A fusion.  4. Mildly dilated left ventricular internal cavity size.  5. The left ventricle demonstrates global hypokinesis.  6. Global right ventricle has low normal systolic function.The right  ventricular size is moderately enlarged. No increase in right ventricular  wall thickness.  7. Left atrial size was mildly dilated.  Moderately elevated pulmonary artery systolic pressure.    PMH:   has no past medical history on file.  PSH:   No past surgical history on file.  Current Outpatient Medications  Medication Sig Dispense Refill  . acetaminophen (TYLENOL) 500 MG tablet Take 500 mg by mouth every 6 (six) hours as needed.    . furosemide (LASIX) 20 MG tablet TAKE 1 TABLET(20 MG) BY MOUTH DAILY 30 tablet 0  . metoprolol succinate (TOPROL-XL) 50 MG 24 hr tablet Take 1 tablet (50 mg total) by mouth daily. Take with or immediately following a meal. 90 tablet 3  . sacubitril-valsartan (ENTRESTO) 49-51 MG Take 1 tablet by mouth 2 (two) times daily. 60 tablet 11   No current facility-administered medications for this visit.  Allergies:   Patient has no known allergies.   Social History:  The patient  reports that she has never smoked. She has never used smokeless tobacco. She reports current alcohol use. She reports that she does not  use drugs.   Family History:   family history includes Breast cancer in her maternal grandmother and mother; Hyperlipidemia in her maternal grandmother and mother; Hypertension in her maternal grandmother and mother; Prostate cancer in her father.    Review of Systems: Review of Systems  Constitutional: Negative.   HENT: Negative.   Respiratory: Negative.   Cardiovascular: Negative.   Gastrointestinal: Negative.   Musculoskeletal: Negative.   Neurological: Negative.   Psychiatric/Behavioral: Negative.   All other systems reviewed and are negative.   PHYSICAL EXAM: VS:  BP (!) 142/90 (BP Location: Left Arm, Patient Position: Sitting, Cuff Size: Large)   Pulse 77   Ht 5\' 1"  (1.549 m)   Wt 197 lb 6 oz (89.5 kg)   LMP 08/26/2018   SpO2 98%   BMI 37.29 kg/m  , BMI Body mass index is 37.29 kg/m. Constitutional:  oriented to person, place, and time. No distress.  HENT:  Head: Normocephalic and atraumatic.  Eyes:  no discharge. No scleral icterus.  Neck: Normal range of motion. Neck supple. No JVD present.  Cardiovascular: Normal rate, regular rhythm, normal heart sounds and intact distal pulses. Exam reveals no gallop and no friction rub. No edema No murmur heard. Pulmonary/Chest: Effort normal and breath sounds normal. No stridor. No respiratory distress.  no wheezes.  no rales.  no tenderness.  Abdominal: Soft.  no distension.  no tenderness.  Musculoskeletal: Normal range of motion.  no  tenderness or deformity.  Neurological:  normal muscle tone. Coordination normal. No atrophy Skin: Skin is warm and dry. No rash noted. not diaphoretic.  Psychiatric:  normal mood and affect. behavior is normal. Thought content normal.    Recent Labs: 04/16/2019: Pro B Natriuretic peptide (BNP) 110.0; TSH 1.96 10/06/2019: ALT 20; BUN 18; Creatinine, Ser 0.96; Hemoglobin 14.7; Platelets 267.0; Potassium 4.7; Sodium 136    Lipid Panel Lab Results  Component Value Date   CHOL 291 (H)  10/06/2019   HDL 46.80 10/06/2019   LDLCALC 151 (H) 03/24/2019   TRIG 311.0 (H) 10/06/2019      Wt Readings from Last 3 Encounters:  10/13/19 197 lb 6 oz (89.5 kg)  10/06/19 195 lb (88.5 kg)  07/14/19 187 lb 2 oz (84.9 kg)     ASSESSMENT AND PLAN:  Problem List Items Addressed This Visit      Cardiology Problems   Essential hypertension   Chronic systolic congestive heart failure (HCC)   Dilated cardiomyopathy (HCC) - Primary     Dilated cardiomyopathy Suspect hypertensive cardiomyopathy On discussion concerning various treatment options for heart failure regiment/cardiomyopathy We will increase Entresto up to 97/103 mg twice daily Discussed adding spironolactone if blood pressure allows in the next month or 2 Continue metoprolol at current dose Lasix daily, lab work reviewed renal function stable BMP ordered in 1 month with increased dose Entresto Discussed further surveillance of ejection fraction, echocardiogram ordered for later this year  Essential hypertension Medication changes as above, Changes discussed  Chronic systolic CHF Euvolemic, medication changes as above Discussed heart failure regimen, CHF education continued, discussed fluid restriction, salt Discussed weight loss, lifestyle modification  Disposition:   F/U  6 months   Total encounter time more than 25 minutes  Greater than 50% was spent in counseling and  coordination of care with the patient    Signed, Esmond Plants, M.D., Ph.D. [place], [place]

## 2019-10-13 ENCOUNTER — Encounter: Payer: Self-pay | Admitting: Cardiovascular Disease

## 2019-10-13 ENCOUNTER — Ambulatory Visit (INDEPENDENT_AMBULATORY_CARE_PROVIDER_SITE_OTHER): Payer: BC Managed Care – PPO | Admitting: Cardiovascular Disease

## 2019-10-13 ENCOUNTER — Other Ambulatory Visit: Payer: Self-pay

## 2019-10-13 VITALS — BP 142/90 | HR 77 | Ht 61.0 in | Wt 197.4 lb

## 2019-10-13 DIAGNOSIS — I5022 Chronic systolic (congestive) heart failure: Secondary | ICD-10-CM | POA: Diagnosis not present

## 2019-10-13 DIAGNOSIS — I420 Dilated cardiomyopathy: Secondary | ICD-10-CM

## 2019-10-13 DIAGNOSIS — I10 Essential (primary) hypertension: Secondary | ICD-10-CM

## 2019-10-13 MED ORDER — ROSUVASTATIN CALCIUM 10 MG PO TABS
10.0000 mg | ORAL_TABLET | Freq: Every day | ORAL | 3 refills | Status: DC
Start: 2019-10-13 — End: 2020-10-03

## 2019-10-13 MED ORDER — SACUBITRIL-VALSARTAN 97-103 MG PO TABS: 1.0000 | ORAL_TABLET | Freq: Two times a day (BID) | ORAL | 11 refills | Status: DC

## 2019-10-13 NOTE — Patient Instructions (Addendum)
Medication Instructions:  Crestor 10 mg daily for cholesterol  Entresto 97/103 mg twice a day  Monitor blood pressure   Make sure to check Blood Pressures 2 hours after your medications.   Please keep a log of your readings so we can see how they trend.  Avoid these things for 30 minutes before checking your blood pressure:  Drinking caffeine.  Drinking alcohol.  Eating.  Smoking.  Exercising.  Five minutes before checking your blood pressure:  Pee.  Sit in a dining chair. Avoid sitting in a soft couch or armchair.  Be quiet. Do not talk.   If you need a refill on your cardiac medications before your next appointment, please call your pharmacy.    Lab work: BMP at the end of August. Go to [facility] entrance and check in at registration. Appointment is not needed and they will direct you where to go.    If you have labs (blood work) drawn today and your tests are completely normal, you will receive your results only by:  MyChart Message (if you have MyChart) OR  A paper copy in the mail If you have any lab test that is abnormal or we need to change your treatment, we will call you to review the results.   Testing/Procedures: Echo in Dec 2021, cardiomyopathy Your physician has requested that you have an echocardiogram. Echocardiography is a painless test that uses sound waves to create images of your heart. It provides your doctor with information about the size and shape of your heart and how well your hearts chambers and valves are working. This procedure takes approximately one hour. There are no restrictions for this procedure.    Follow-Up: At [facility], you and your health needs are our priority.  As part of our continuing mission to provide you with exceptional heart care, we have created designated Provider Care Teams.  These Care Teams include your primary Cardiologist (physician) and Advanced Practice Providers (APPs -  Physician Assistants  and Nurse Practitioners) who all work together to provide you with the care you need, when you need it.   You will need a follow up appointment in 6 months    Providers on your designated Care Team:    Nicolasa Ducking, NP  Eula Listen, PA-C  Marisue Ivan, PA-C  Any Other Special Instructions Will Be Listed Below (If Applicable).  COVID-19 Vaccine Information can be found at: [email or web address].[email or web address] For questions related to vaccine distribution or appointments, please email [email or web address] or call [phone].     How to Take Your Blood Pressure You can take your blood pressure at home with a machine. You may need to check your blood pressure at home:  To check if you have high blood pressure (hypertension).  To check your blood pressure over time.  To make sure your blood pressure medicine is working. Supplies needed: You will need a blood pressure machine, or monitor. You can buy one at a drugstore or online. When choosing one:  Choose one with an arm cuff.  Choose one that wraps around your upper arm. Only one finger should fit between your arm and the cuff.  Do not choose one that measures your blood pressure from your wrist or finger. Your doctor can suggest a monitor. How to prepare Avoid these things for 30 minutes before checking your blood pressure:  Drinking caffeine.  Drinking alcohol.  Eating.  Smoking.  Exercising. Five minutes before checking your blood pressure:  Pee.  Sit in a  dining chair. Avoid sitting in a soft couch or armchair.  Be quiet. Do not talk. How to take your blood pressure Follow the instructions that came with your machine. If you have a digital blood pressure monitor, these may be the instructions: 1. Sit up straight. 2. Place your feet on the floor. Do not cross your ankles or legs. 3. Rest your left arm at the level of your heart. You may rest it on a table,  desk, or chair. 4. Pull up your shirt sleeve. 5. Wrap the blood pressure cuff around the upper part of your left arm. The cuff should be 1 inch (2.5 cm) above your elbow. It is best to wrap the cuff around bare skin. 6. Fit the cuff snugly around your arm. You should be able to place only one finger between the cuff and your arm. 7. Put the cord inside the groove of your elbow. 8. Press the power button. 9. Sit quietly while the cuff fills with air and loses air. 10. Write down the numbers on the screen. 11. Wait 2-3 minutes and then repeat steps 1-10. What do the numbers mean? Two numbers make up your blood pressure. The first number is called systolic pressure. The second is called diastolic pressure. An example of a blood pressure reading is "120 over 80" (or 120/80). If you are an adult and do not have a medical condition, use this guide to find out if your blood pressure is normal: Normal  First number: below 120.  Second number: below 80. Elevated  First number: 120-129.  Second number: below 80. Hypertension stage 1  First number: 130-139.  Second number: 80-89. Hypertension stage 2  First number: 140 or above.  Second number: 90 or above. Your blood pressure is above normal even if only the top or bottom number is above normal. Follow these instructions at home:  Check your blood pressure as often as your doctor tells you to.  Take your monitor to your next doctor's appointment. Your doctor will: ? Make sure you are using it correctly. ? Make sure it is working right.  Make sure you understand what your blood pressure numbers should be.  Tell your doctor if your medicines are causing side effects. Contact a doctor if:  Your blood pressure keeps being high. Get help right away if:  Your first blood pressure number is higher than 180.  Your second blood pressure number is higher than 120. This information is not intended to replace advice given to you by your  health care provider. Make sure you discuss any questions you have with your health care provider. Document Revised: 02/22/2017 Document Reviewed: 08/19/2015 Elsevier Patient Education  2020 [employer].  Echocardiogram An echocardiogram is a procedure that uses painless sound waves (ultrasound) to produce an image of the heart. Images from an echocardiogram can provide important information about:  Signs of coronary artery disease (CAD).  Aneurysm detection. An aneurysm is a weak or damaged part of an artery wall that bulges out from the normal force of blood pumping through the body.  Heart size and shape. Changes in the size or shape of the heart can be associated with certain conditions, including heart failure, aneurysm, and CAD.  Heart muscle function.  Heart valve function.  Signs of a past heart attack.  Fluid buildup around the heart.  Thickening of the heart muscle.  A tumor or infectious growth around the heart valves. Tell a health care provider about:  Any allergies  you have.  All medicines you are taking, including vitamins, herbs, eye drops, creams, and over-the-counter medicines.  Any blood disorders you have.  Any surgeries you have had.  Any medical conditions you have.  Whether you are pregnant or may be pregnant. What are the risks? Generally, this is a safe procedure. However, problems may occur, including:  Allergic reaction to dye (contrast) that may be used during the procedure. What happens before the procedure? No specific preparation is needed. You may eat and drink normally. What happens during the procedure?   An IV tube may be inserted into one of your veins.  You may receive contrast through this tube. A contrast is an injection that improves the quality of the pictures from your heart.  A gel will be applied to your chest.  A wand-like tool (transducer) will be moved over your chest. The gel will help to transmit the sound waves  from the transducer.  The sound waves will harmlessly bounce off of your heart to allow the heart images to be captured in real-time motion. The images will be recorded on a computer. The procedure may vary among health care providers and hospitals. What happens after the procedure?  You may return to your normal, everyday life, including diet, activities, and medicines, unless your health care provider tells you not to do that. Summary  An echocardiogram is a procedure that uses painless sound waves (ultrasound) to produce an image of the heart.  Images from an echocardiogram can provide important information about the size and shape of your heart, heart muscle function, heart valve function, and fluid buildup around your heart.  You do not need to do anything to prepare before this procedure. You may eat and drink normally.  After the echocardiogram is completed, you may return to your normal, everyday life, unless your health care provider tells you not to do that. This information is not intended to replace advice given to you by your health care provider. Make sure you discuss any questions you have with your health care provider. Document Revised: 07/03/2018 Document Reviewed: 04/14/2016 Elsevier Patient Education  2020 [employer].

## 2019-10-23 ENCOUNTER — Telehealth: Payer: Self-pay | Admitting: *Deleted

## 2019-10-23 NOTE — Telephone Encounter (Signed)
"  Rejection Reason -  Patient did not respond - I have been unable to reach this patient by phone.  A letter is being sent. Per Radene Knee 10/21/2019" [facility] said 2 days ago

## 2019-10-23 NOTE — Telephone Encounter (Signed)
Ok to close referral

## 2019-10-23 NOTE — Telephone Encounter (Signed)
Noted. Referral closed.

## 2019-10-27 ENCOUNTER — Other Ambulatory Visit: Payer: Self-pay | Admitting: Internal Medicine

## 2020-02-08 ENCOUNTER — Other Ambulatory Visit: Payer: Self-pay | Admitting: Internal Medicine

## 2020-03-10 ENCOUNTER — Other Ambulatory Visit: Payer: BC Managed Care – PPO

## 2020-03-24 ENCOUNTER — Ambulatory Visit (INDEPENDENT_AMBULATORY_CARE_PROVIDER_SITE_OTHER): Payer: BC Managed Care – PPO

## 2020-03-24 ENCOUNTER — Other Ambulatory Visit: Payer: Self-pay

## 2020-03-24 DIAGNOSIS — I420 Dilated cardiomyopathy: Secondary | ICD-10-CM

## 2020-03-25 LAB — ECHOCARDIOGRAM COMPLETE
AR max vel: 2.39 cm2
AV Area VTI: 2.45 cm2
AV Area mean vel: 2.41 cm2
AV Mean grad: 3 mmHg
AV Peak grad: 5.5 mmHg
Ao pk vel: 1.17 m/s
Area-P 1/2: 4.01 cm2
Calc EF: 58.2 %
S' Lateral: 3.4 cm
Single Plane A2C EF: 60.7 %
Single Plane A4C EF: 54.7 %

## 2020-03-28 ENCOUNTER — Telehealth: Payer: Self-pay

## 2020-03-28 NOTE — Telephone Encounter (Signed)
Able to reach pt regarding her recent Echocardiogram , Dr. Mariah Milling had a chance to review her results and advised   "Normal cardiac function  Overall great news, no valve disease"  Pt educated on no new medications or change in plan of care, stick to currennt medications, otherwise all questions or concerns were address and no additional concerns at this time. Agreeable to plan, will call back for anything further.

## 2020-04-12 ENCOUNTER — Ambulatory Visit: Payer: BC Managed Care – PPO | Admitting: Cardiovascular Disease

## 2020-05-09 NOTE — Progress Notes (Signed)
Cardiology Office Note  Date:  05/10/2020   ID:  Rebecca Bennett, DOB 06/10/1965, MRN [id number]  PCP:  Rebecca Munroe, NP   Chief Complaint  Patient presents with  . Other    6 month f/u c/o elevated BP. Meds reviewed verbally with pt.    HPI:  [name] is a 55 year old woman with past medical history of Hypertension Dilated cardiomyopathy Seen in the ER 02/19/2019, HTN, 209/143  2+ lower extremity edema to bilateral lower extremities since 01/2019 Ejection fraction 30 to 35% on echocardiogram April 14, 2019 Presumed to be nonischemic, hypertensive heart disease with CHF  She is a non-smoker, no diabetes Who presents for follow-up of her cardiomyopathy  Last seen in clinic by myself July  2021 Ejection fraction 30 to 35% January 2021 in the setting of losing her husband in May 2020, daughter leaving for college, significant stress  Stress test performed April 2021, low risk study, no significant coronary calcification  Echo 02/2020 Left ventricular ejection fraction, by estimation, is 60 to 65%.  Dieting,  aptivia diet, down 20 pounds Does not involve aerobic activity  BP elevatated today Does not have numbers from home Has access to blood pressure's were No leg swelling, no PND orthopnea  Lab work from May 2021 reviewed Trend up in potassium  EKG personally reviewed by myself on todays visit Normal sinus rhythm with rate 77 bpm no significant ST-T wave changes  Other past medical history reviewed Leg Swelling started 01/2019  may have had very slight abdominal bloating shortness of breath  Seen in the emergency room February 19, 2019 for hypertension Chest x-ray a mild to moderate cardiomegaly Had follow-up with primary care with medication changes Echocardiogram ordered  Echo 04/14/2019 1. Left ventricular ejection fraction, by visual estimation, is 30 to  35%. The left ventricle has severely decreased function. There is  moderately  increased left ventricular hypertrophy.  2. Severe hypokinesis of the left ventricular, entire anteroseptal wall.  3. Indeterminate diastolic filling due to E-A fusion.  4. Mildly dilated left ventricular internal cavity size.  5. The left ventricle demonstrates global hypokinesis.  6. Global right ventricle has low normal systolic function.The right  ventricular size is moderately enlarged. No increase in right ventricular  wall thickness.  7. Left atrial size was mildly dilated.  Moderately elevated pulmonary artery systolic pressure.    PMH:   has no past medical history on file.  PSH:   History reviewed. No pertinent surgical history.  Current Outpatient Medications  Medication Sig Dispense Refill  . acetaminophen (TYLENOL) 500 MG tablet Take 500 mg by mouth every 6 (six) hours as needed.    . furosemide (LASIX) 20 MG tablet TAKE 1 TABLET(20 MG) BY MOUTH DAILY 90 tablet 1  . metoprolol succinate (TOPROL-XL) 50 MG 24 hr tablet Take 1 tablet (50 mg total) by mouth daily. Take with or immediately following a meal. 90 tablet 3  . rosuvastatin (CRESTOR) 10 MG tablet Take 1 tablet (10 mg total) by mouth daily. 90 tablet 3  . sacubitril-valsartan (ENTRESTO) 97-103 MG Take 1 tablet by mouth 2 (two) times daily. 60 tablet 11   No current facility-administered medications for this visit.     Allergies:   Patient has no known allergies.   Social History:  The patient  reports that she has never smoked. She has never used smokeless tobacco. She reports current alcohol use. She reports that she does not use drugs.   Family History:  family history includes Breast cancer in her maternal grandmother and mother; Hyperlipidemia in her maternal grandmother and mother; Hypertension in her maternal grandmother and mother; Prostate cancer in her father.    Review of Systems: Review of Systems  Constitutional: Negative.   HENT: Negative.   Respiratory: Negative.   Cardiovascular:  Negative.   Gastrointestinal: Negative.   Musculoskeletal: Negative.   Neurological: Negative.   Psychiatric/Behavioral: Negative.   All other systems reviewed and are negative.   PHYSICAL EXAM: VS:  BP (!) 154/100 (BP Location: Left Arm, Patient Position: Sitting, Cuff Size: Large)   Pulse 77   Ht 5\' 1"  (1.549 m)   Wt 181 lb 4 oz (82.2 kg)   LMP 08/26/2018   SpO2 98%   BMI 34.25 kg/m  , BMI Body mass index is 34.25 kg/m. Constitutional:  oriented to person, place, and time. No distress.  HENT:  Head: Grossly normal Eyes:  no discharge. No scleral icterus.  Neck: No JVD, no carotid bruits  Cardiovascular: Regular rate and rhythm, no murmurs appreciated Pulmonary/Chest: Clear to auscultation bilaterally, no wheezes or rails Abdominal: Soft.  no distension.  no tenderness.  Musculoskeletal: Normal range of motion Neurological:  normal muscle tone. Coordination normal. No atrophy Skin: Skin warm and dry Psychiatric: normal affect, pleasant   Recent Labs: 10/06/2019: ALT 20; BUN 18; Creatinine, Ser 0.96; Hemoglobin 14.7; Platelets 267.0; Potassium 4.7; Sodium 136    Lipid Panel Lab Results  Component Value Date   CHOL 291 (H) 10/06/2019   HDL 46.80 10/06/2019   LDLCALC 151 (H) 03/24/2019   TRIG 311.0 (H) 10/06/2019      Wt Readings from Last 3 Encounters:  05/10/20 181 lb 4 oz (82.2 kg)  10/13/19 197 lb 6 oz (89.5 kg)  10/06/19 195 lb (88.5 kg)     ASSESSMENT AND PLAN:  Problem List Items Addressed This Visit      Cardiology Problems   Essential hypertension   Chronic systolic congestive heart failure (HCC)   Dilated cardiomyopathy (HCC) - Primary     Dilated cardiomyopathy 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  Entresto up to 97/103 mg twice daily, continue metoprolol Blood pressure elevated, she will call 10/08/19 with numbers from home We will recheck BMP before adding spironolactone or other agents Other options for blood pressure include clonidine,  Cardura, nitrates, hydralazine  Essential hypertension We will have her monitor pressures at home and call us with numbers Encouraged continued gentle weight loss  Chronic systolic CHF Euvolemic,  No changes today, Stressed importance of aggressive blood pressure control  Hyperlipidemia On Crestor 10 daily, will recheck lipids today    Total encounter time more than 25 minutes  Greater than 50% was spent in counseling and coordination of care with the patient    Signed, [place], M.D., Ph.D. [facility] [place], [place]

## 2020-05-10 ENCOUNTER — Other Ambulatory Visit: Payer: Self-pay

## 2020-05-10 ENCOUNTER — Ambulatory Visit (INDEPENDENT_AMBULATORY_CARE_PROVIDER_SITE_OTHER): Payer: BC Managed Care – PPO | Admitting: Cardiovascular Disease

## 2020-05-10 ENCOUNTER — Encounter: Payer: Self-pay | Admitting: Cardiovascular Disease

## 2020-05-10 DIAGNOSIS — I5022 Chronic systolic (congestive) heart failure: Secondary | ICD-10-CM

## 2020-05-10 DIAGNOSIS — I10 Essential (primary) hypertension: Secondary | ICD-10-CM

## 2020-05-10 DIAGNOSIS — I420 Dilated cardiomyopathy: Secondary | ICD-10-CM

## 2020-05-10 DIAGNOSIS — E782 Mixed hyperlipidemia: Secondary | ICD-10-CM | POA: Diagnosis not present

## 2020-05-10 NOTE — Patient Instructions (Addendum)
Medication Instructions:  No changes   Lab work: CMP, Lipid (You will see you results in your MyChart the same time the doctor receives the results, they need time to review results and make recommendations, you can expect a call a few days after your test)  Testing/Procedures: No new testing needed  Follow-Up:  . You will need a follow up appointment in 6 months  . Providers on your designated Care Team:   . Nicolasa Ducking, NP . Eula Listen, PA-C . Marisue Ivan, PA-C   COVID-19 Vaccine Information can be found at: [email or web address] For questions related to vaccine distribution or appointments, please email [email or web address] or call [phone].   Please monitor blood pressures and keep a log of your readings.  Upload you daily BP reading to MyChart in 1-2 weeks    How to use a home blood pressure monitor. . Be still. . Measure at the same time every day. It's important to take the readings at the same time each day, such as morning and evening. Take reading approximately 1 1/2 to 2 hours after BP medications.   AVOID these things for 30 minutes before checking your blood pressure:  Drinking caffeine.  Drinking alcohol.  Eating.  Smoking.  Exercising.   Five minutes before checking your blood pressure:  Pee.  Sit in a dining chair. Avoid sitting in a soft couch or armchair.  Be quiet. Do not talk.       Sit correctly. Sit with your back straight and supported (on a dining chair, rather than a sofa). Your feet should be flat on the floor and your legs should not be crossed. Your arm should be supported on a flat surface (such as a table) with the upper arm at heart level. Make sure the bottom of the cuff is placed directly above the bend of the elbow.

## 2020-05-11 LAB — COMPREHENSIVE METABOLIC PANEL
ALT: 17 IU/L (ref 0–32)
AST: 13 IU/L (ref 0–40)
Albumin/Globulin Ratio: 2 (ref 1.2–2.2)
Albumin: 4.7 g/dL (ref 3.8–4.9)
Alkaline Phosphatase: 86 IU/L (ref 44–121)
BUN/Creatinine Ratio: 20 (ref 9–23)
BUN: 16 mg/dL (ref 6–24)
Bilirubin Total: 0.2 mg/dL (ref 0.0–1.2)
CO2: 22 mmol/L (ref 20–29)
Calcium: 9.3 mg/dL (ref 8.7–10.2)
Chloride: 103 mmol/L (ref 96–106)
Creatinine, Ser: 0.79 mg/dL (ref 0.57–1.00)
GFR calc Af Amer: 99 mL/min/{1.73_m2} (ref >59)
GFR calc non Af Amer: 86 mL/min/{1.73_m2} (ref >59)
Globulin, Total: 2.3 g/dL (ref 1.5–4.5)
Glucose: 106 mg/dL — ABNORMAL HIGH (ref 65–99)
Potassium: 4.2 mmol/L (ref 3.5–5.2)
Sodium: 142 mmol/L (ref 134–144)
Total Protein: 7 g/dL (ref 6.0–8.5)

## 2020-05-11 LAB — LIPID PANEL
Chol/HDL Ratio: 4.1 ratio (ref 0.0–4.4)
Cholesterol, Total: 190 mg/dL (ref 100–199)
HDL: 46 mg/dL (ref >39)
LDL Chol Calc (NIH): 104 mg/dL — ABNORMAL HIGH (ref 0–99)
Triglycerides: 232 mg/dL — ABNORMAL HIGH (ref 0–149)
VLDL Cholesterol Cal: 40 mg/dL (ref 5–40)

## 2020-05-27 ENCOUNTER — Other Ambulatory Visit: Payer: Self-pay | Admitting: Cardiovascular Disease

## 2020-09-30 ENCOUNTER — Other Ambulatory Visit: Payer: Self-pay | Admitting: Internal Medicine

## 2020-09-30 NOTE — Telephone Encounter (Signed)
   Notes to clinic Not a pt in this practice, please assess.  

## 2020-09-30 NOTE — Telephone Encounter (Signed)
LAST OV - 10/06/2019 NEXT OV - N/A LAST FILLED -  02/09/2020

## 2020-10-02 ENCOUNTER — Other Ambulatory Visit: Payer: Self-pay | Admitting: Cardiovascular Disease

## 2020-11-07 NOTE — Progress Notes (Signed)
Cardiology Office Note  Date:  11/08/2020   ID:  Rebecca Bennett, DOB [birth year], MRN [id number]  PCP:  Lorre Munroe, NP   Chief Complaint  Patient presents with   6 month follow up     "Doing well." Medications reviewed by the patient verbally.     HPI:  Rebecca Bennett is a 55 year old woman with past medical history of Hypertension Dilated cardiomyopathy Seen in the ER 02/19/2019, HTN, 209/143  2+ lower extremity edema to bilateral lower extremities since 01/2019 Ejection fraction 30 to 35% on echocardiogram April 14, 2019 Presumed to be nonischemic, hypertensive heart disease with CHF  non-smoker, no diabetes Echocardiogram ejection fraction 60% December 2021 Who presents for follow-up of her nonischemic cardiomyopathy, hypertensive heart disease  In follow-up today reports that she feels well with no complaints  Echo 02/2020 reviewed on today's visit 1. Left ventricular ejection fraction, by estimation, is 60 to 65%. The  left ventricle has normal function. The left ventricle has no regional  wall motion abnormalities. Left ventricular diastolic parameters were  normal. The average left ventricular  global longitudinal strain is -15.9 %. The global longitudinal strain is  normal.   2. Right ventricular systolic function is normal. The right ventricular  size is normal. There is normal pulmonary artery systolic pressure. The  estimated right ventricular systolic pressure is 31.5 mmHg.   Ejection fraction 30 to 35% January 2021 in the setting of losing her husband in May 2020, daughter leaving for college, significant stress  Does not check blood pressure at home Tolerating Entresto, Lasix, metoprolol succinate Weight stable, no leg swelling no PND orthopnea  EKG personally reviewed by myself on todays visit Normal sinus rhythm rate 80 bpm no significant ST-T wave changes  Other past medical history reviewed Stress test performed April 2021, low risk  study, no significant coronary calcification  Leg Swelling started 01/2019  may have had very slight abdominal bloating shortness of breath   PMH:   has no past medical history on file.  PSH:   History reviewed. No pertinent surgical history.  Current Outpatient Medications  Medication Sig Dispense Refill   acetaminophen (TYLENOL) 500 MG tablet Take 500 mg by mouth every 6 (six) hours as needed.     furosemide (LASIX) 20 MG tablet TAKE 1 TABLET(20 MG) BY MOUTH DAILY 90 tablet 1   metoprolol succinate (TOPROL-XL) 50 MG 24 hr tablet TAKE 1 TABLET(50 MG) BY MOUTH DAILY WITH OR IMMEDIATELY FOLLOWING A MEAL 90 tablet 3   rosuvastatin (CRESTOR) 10 MG tablet TAKE 1 TABLET(10 MG) BY MOUTH DAILY 90 tablet 0   sacubitril-valsartan (ENTRESTO) 97-103 MG Take 1 tablet by mouth 2 (two) times daily. 60 tablet 11   No current facility-administered medications for this visit.     Allergies:   Patient has no known allergies.   Social History:  The patient  reports that she has never smoked. She has never used smokeless tobacco. She reports current alcohol use. She reports that she does not use drugs.   Family History:   family history includes Breast cancer in her maternal grandmother and mother; Hyperlipidemia in her maternal grandmother and mother; Hypertension in her maternal grandmother and mother; Prostate cancer in her father.    Review of Systems: Review of Systems  Constitutional: Negative.   HENT: Negative.    Respiratory: Negative.    Cardiovascular: Negative.   Gastrointestinal: Negative.   Musculoskeletal: Negative.   Neurological: Negative.   Psychiatric/Behavioral: Negative.  All other systems reviewed and are negative.  PHYSICAL EXAM: VS:  BP (!) 154/100 (BP Location: Left Arm, Patient Position: Sitting, Cuff Size: Large)   Pulse 80   Ht 5\' 1"  (1.549 m)   Wt 195 lb (88.5 kg)   LMP 08/26/2018   SpO2 98%   BMI 36.84 kg/m  , BMI Body mass index is 36.84  kg/m. Constitutional:  oriented to person, place, and time. No distress.  HENT:  Head: Grossly normal Eyes:  no discharge. No scleral icterus.  Neck: No JVD, no carotid bruits  Cardiovascular: Regular rate and rhythm, no murmurs appreciated Pulmonary/Chest: Clear to auscultation bilaterally, no wheezes or rails Abdominal: Soft.  no distension.  no tenderness.  Musculoskeletal: Normal range of motion Neurological:  normal muscle tone. Coordination normal. No atrophy Skin: Skin warm and dry Psychiatric: normal affect, pleasant  Recent Labs: 05/10/2020: ALT 17; BUN 16; Creatinine, Ser 0.79; Potassium 4.2; Sodium 142    Lipid Panel Lab Results  Component Value Date   CHOL 190 05/10/2020   HDL 46 05/10/2020   LDLCALC 104 (H) 05/10/2020   TRIG 232 (H) 05/10/2020      Wt Readings from Last 3 Encounters:  11/08/20 195 lb (88.5 kg)  05/10/20 181 lb 4 oz (82.2 kg)  10/13/19 197 lb 6 oz (89.5 kg)     ASSESSMENT AND PLAN:  Problem List Items Addressed This Visit       Cardiology Problems   Essential hypertension   Chronic systolic congestive heart failure (HCC)   Dilated cardiomyopathy (HCC) - Primary   Other Visit Diagnoses     Mixed hyperlipidemia         Dilated cardiomyopathy 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 Continue Entresto up to 97/103 mg twice daily,  Given elevated blood pressure we will stop metoprolol succinate start Coreg 25 twice daily Other options for blood pressure include clonidine, Cardura, nitrates, hydralazine Recommend she call 10/15/19 with blood pressure measurements  Essential hypertension Changes as above  Chronic systolic CHF Euvolemic,  Stressed importance of aggressive blood pressure control, monitoring at home  Hyperlipidemia On Crestor 10 daily, numbers better    Total encounter time more than 25 minutes  Greater than 50% was spent in counseling and coordination of care with the patient    Signed, [place], M.D., Ph.D. [facility] [place], [place]

## 2020-11-08 ENCOUNTER — Other Ambulatory Visit: Payer: Self-pay

## 2020-11-08 ENCOUNTER — Ambulatory Visit (INDEPENDENT_AMBULATORY_CARE_PROVIDER_SITE_OTHER): Payer: BC Managed Care – PPO | Admitting: Cardiovascular Disease

## 2020-11-08 ENCOUNTER — Encounter: Payer: Self-pay | Admitting: Cardiovascular Disease

## 2020-11-08 VITALS — BP 154/100 | HR 80 | Ht 61.0 in | Wt 195.0 lb

## 2020-11-08 DIAGNOSIS — I10 Essential (primary) hypertension: Secondary | ICD-10-CM | POA: Diagnosis not present

## 2020-11-08 DIAGNOSIS — Z79899 Other long term (current) drug therapy: Secondary | ICD-10-CM | POA: Diagnosis not present

## 2020-11-08 DIAGNOSIS — I5022 Chronic systolic (congestive) heart failure: Secondary | ICD-10-CM

## 2020-11-08 DIAGNOSIS — I420 Dilated cardiomyopathy: Secondary | ICD-10-CM

## 2020-11-08 DIAGNOSIS — E782 Mixed hyperlipidemia: Secondary | ICD-10-CM | POA: Diagnosis not present

## 2020-11-08 MED ORDER — CARVEDILOL 25 MG PO TABS: 25.0000 mg | ORAL_TABLET | Freq: Two times a day (BID) | ORAL | 3 refills | Status: DC

## 2020-11-08 MED ORDER — SACUBITRIL-VALSARTAN 97-103 MG PO TABS
1.0000 | ORAL_TABLET | Freq: Two times a day (BID) | ORAL | 3 refills | Status: DC
Start: 2020-11-08 — End: 2021-11-09

## 2020-11-08 NOTE — Patient Instructions (Addendum)
Medication Instructions:   Please STOP Metoprolol  Please START  Coreg 25 mg twice a day  If you need a refill on your cardiac medications before your next appointment, please call your pharmacy.    Lab work: [employer] Labs will appear on MyChart, only ABNORMAL results will be called  Testing/Procedures: No new testing needed   Follow-Up: At [facility], you and your health needs are our priority.  As part of our continuing mission to provide you with exceptional heart care, we have created designated Provider Care Teams.  These Care Teams include your primary Cardiologist (physician) and Advanced Practice Providers (APPs -  Physician Assistants and Nurse Practitioners) who all work together to provide you with the care you need, when you need it.  You will need a follow up appointment in 6 months  Providers on your designated Care Team:   Nicolasa Ducking, NP Eula Listen, PA-C Marisue Ivan, PA-C [place], [place]  COVID-19 Vaccine Information can be found at: [email or web address] For questions related to vaccine distribution or appointments, please email [email or web address] or call [phone].   Please monitor blood pressures and keep a log of your readings.   How to use a home blood pressure monitor. Be still. Measure at the same time every day. It's important to take the readings at the same time each day, such as morning and evening. Take reading approximately 1 1/2 to 2 hours after BP medications.   AVOID these things for 30 minutes before checking your blood pressure: Drinking caffeine. Drinking alcohol. Eating. Smoking. Exercising.   Five minutes before checking your blood pressure: Pee. Sit in a dining chair. Avoid sitting in a soft couch or armchair. Be quiet. Do not talk.       Sit correctly. Sit with your back straight and supported (on a dining chair, rather than a sofa). Your feet should be  flat on the floor and your legs should not be crossed. Your arm should be supported on a flat surface (such as a table) with the upper arm at heart level. Make sure the bottom of the cuff is placed directly above the bend of the elbow.

## 2020-11-09 LAB — BASIC METABOLIC PANEL
BUN/Creatinine Ratio: 16 (ref 9–23)
BUN: 12 mg/dL (ref 6–24)
CO2: 24 mmol/L (ref 20–29)
Calcium: 9.6 mg/dL (ref 8.7–10.2)
Chloride: 104 mmol/L (ref 96–106)
Creatinine, Ser: 0.75 mg/dL (ref 0.57–1.00)
Glucose: 96 mg/dL (ref 65–99)
Potassium: 4.8 mmol/L (ref 3.5–5.2)
Sodium: 143 mmol/L (ref 134–144)
eGFR: 95 mL/min/{1.73_m2} (ref >59)

## 2021-04-08 IMAGING — CR DG CHEST 2V
1 series · 2 of 2 positions shown · non-contrast
Comparison: None.

CLINICAL DATA: Hypertension.  L5BTH-30 exposure.

EXAM:
CHEST - 2 VIEW

[Series 1: dg chest 2 view · 0.14mm/px · 2 of 2 slices shown]
[im 1/2]
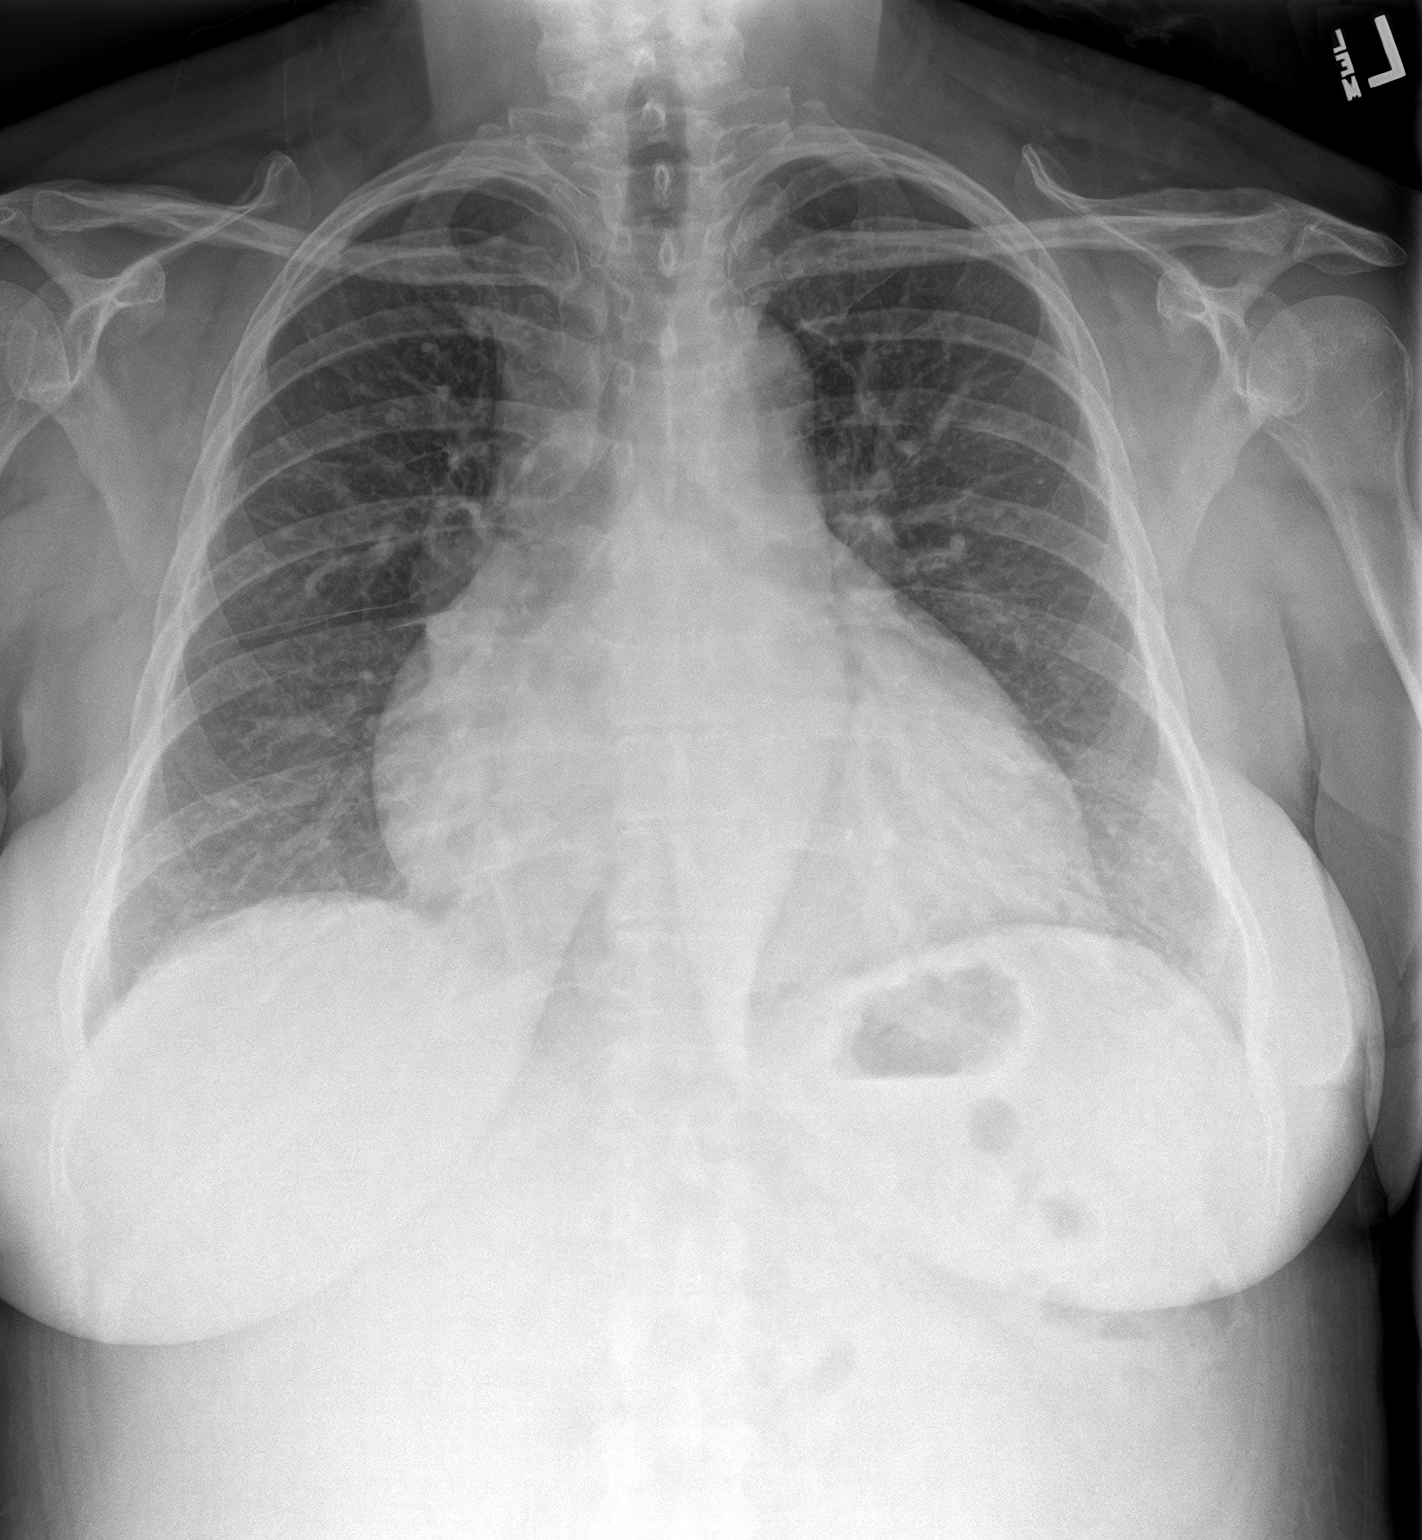
[im 2/2]
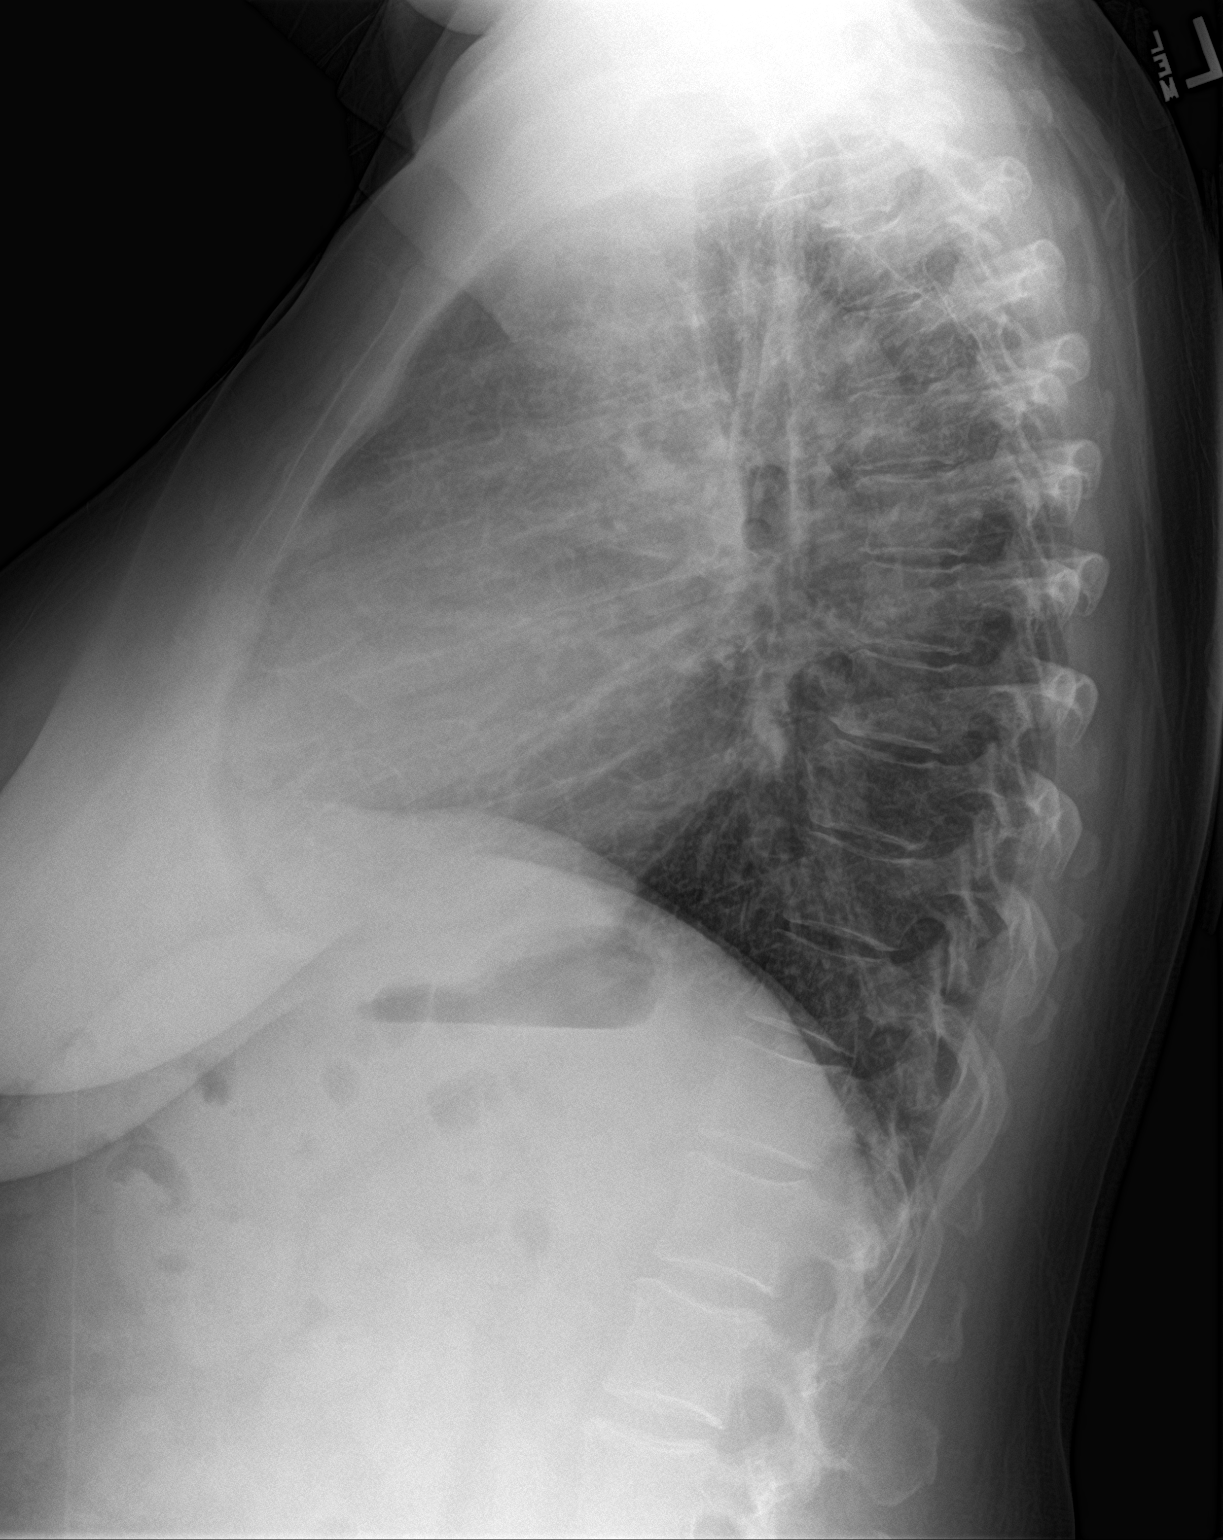

[2 of 2 positions shown; findings below may reference images not displayed]

FINDINGS: Lungs are adequately inflated without focal airspace consolidation
or effusion. There is mild to moderate cardiomegaly. Bones and soft
tissues are unremarkable.
IMPRESSION: 1.  No acute cardiopulmonary disease.

2.  Mild-to-moderate cardiomegaly.

## 2021-05-12 ENCOUNTER — Ambulatory Visit: Payer: BC Managed Care – PPO | Admitting: Cardiovascular Disease

## 2021-05-16 ENCOUNTER — Other Ambulatory Visit: Payer: Self-pay | Admitting: Family

## 2021-05-27 ENCOUNTER — Other Ambulatory Visit: Payer: Self-pay | Admitting: Family

## 2021-06-22 ENCOUNTER — Other Ambulatory Visit: Payer: Self-pay | Admitting: Cardiovascular Disease

## 2021-06-22 NOTE — Telephone Encounter (Signed)
No answer, mailbox full

## 2021-06-22 NOTE — Telephone Encounter (Signed)
Please reschedule F/U appointment that patient canceled. Thank you! ?

## 2021-07-04 NOTE — Telephone Encounter (Signed)
Attempted to schedule.  

## 2021-07-26 NOTE — Telephone Encounter (Signed)
Attempted to schedule.  

## 2021-08-17 ENCOUNTER — Ambulatory Visit (INDEPENDENT_AMBULATORY_CARE_PROVIDER_SITE_OTHER): Payer: Managed Care, Other (non HMO) | Admitting: Internal Medicine

## 2021-08-17 ENCOUNTER — Encounter: Payer: Self-pay | Admitting: Internal Medicine

## 2021-08-17 VITALS — BP 173/95 | HR 85 | Temp 97.3°F | Ht 61.5 in | Wt 209.0 lb

## 2021-08-17 DIAGNOSIS — Z1211 Encounter for screening for malignant neoplasm of colon: Secondary | ICD-10-CM

## 2021-08-17 DIAGNOSIS — Z0001 Encounter for general adult medical examination with abnormal findings: Secondary | ICD-10-CM

## 2021-08-17 DIAGNOSIS — I420 Dilated cardiomyopathy: Secondary | ICD-10-CM | POA: Diagnosis not present

## 2021-08-17 DIAGNOSIS — Z1231 Encounter for screening mammogram for malignant neoplasm of breast: Secondary | ICD-10-CM

## 2021-08-17 DIAGNOSIS — Z1159 Encounter for screening for other viral diseases: Secondary | ICD-10-CM | POA: Diagnosis not present

## 2021-08-17 DIAGNOSIS — I10 Essential (primary) hypertension: Secondary | ICD-10-CM | POA: Diagnosis not present

## 2021-08-17 DIAGNOSIS — Z6838 Body mass index (BMI) 38.0-38.9, adult: Secondary | ICD-10-CM

## 2021-08-17 MED ORDER — FUROSEMIDE 20 MG PO TABS: 20.0000 mg | ORAL_TABLET | Freq: Every day | ORAL | 1 refills | Status: DC

## 2021-08-17 NOTE — Progress Notes (Signed)
Subjective:    Patient ID: Rebecca Bennett, female    DOB: [birth year], 56 y.o.   MRN: [id number]  HPI  Patient presents to clinic today for annual exam.  Of note, her BP today is 178/114.  She has a history of chronic hypertension, reports she has been on multiple meds per cardiology and has been unable to get this under control.  Flu: never Tetanus: 09/2019 COVID: never Shingrix: never Pap smear: > 5 years ago Mammogram: > 2 years ago Colon screening: never Vision screening: as needed Dentist: biannually  Diet: She does eat meat. She consumes more veggies than fruits. She does eat some fried foods. She drinks mostly water. Exercise: None  Review of Systems  No past medical history on file.  Current Outpatient Medications  Medication Sig Dispense Refill   acetaminophen (TYLENOL) 500 MG tablet Take 500 mg by mouth every 6 (six) hours as needed.     carvedilol (COREG) 25 MG tablet Take 1 tablet (25 mg total) by mouth 2 (two) times daily. 180 tablet 3   furosemide (LASIX) 20 MG tablet TAKE 1 TABLET(20 MG) BY MOUTH DAILY 90 tablet 1   rosuvastatin (CRESTOR) 10 MG tablet TAKE 1 TABLET(10 MG) BY MOUTH DAILY 30 tablet 0   sacubitril-valsartan (ENTRESTO) 97-103 MG Take 1 tablet by mouth 2 (two) times daily. 180 tablet 3   No current facility-administered medications for this visit.    No Known Allergies  Family History  Problem Relation Age of Onset   Hypertension Mother    Breast cancer Mother    Hyperlipidemia Mother    Prostate cancer Father    Hypertension Maternal Grandmother    Breast cancer Maternal Grandmother    Hyperlipidemia Maternal Grandmother     Social History   Socioeconomic History   Marital status: Widowed    Spouse name: Not on file   Number of children: Not on file   Years of education: Not on file   Highest education level: Not on file  Occupational History   Not on file  Tobacco Use   Smoking status: Never   Smokeless tobacco: Never   Vaping Use   Vaping Use: Never used  Substance and Sexual Activity   Alcohol use: Yes    Comment: occasional   Drug use: Never   Sexual activity: Not on file  Other Topics Concern   Not on file  Social History Narrative   Not on file   Social Determinants of Health   Financial Resource Strain: Not on file  Food Insecurity: Not on file  Transportation Needs: Not on file  Physical Activity: Not on file  Stress: Not on file  Social Connections: Not on file  Intimate Partner Violence: Not on file     Constitutional: Denies fever, malaise, fatigue, headache or abrupt weight changes.  HEENT: Denies eye pain, eye redness, ear pain, ringing in the ears, wax buildup, runny nose, nasal congestion, bloody nose, or sore throat. Respiratory: Denies difficulty breathing, shortness of breath, cough or sputum production.   Cardiovascular: Denies chest pain, chest tightness, palpitations or swelling in the hands or feet.  Gastrointestinal: Denies abdominal pain, bloating, constipation, diarrhea or blood in the stool.  GU: Denies urgency, frequency, pain with urination, burning sensation, blood in urine, odor or discharge. Musculoskeletal: Denies decrease in range of motion, difficulty with gait, muscle pain or joint pain and swelling.  Skin: Denies redness, rashes, lesions or ulcercations.  Neurological: Denies dizziness, difficulty with memory, difficulty with  speech or problems with balance and coordination.  Psych: Denies anxiety, depression, SI/HI.  No other specific complaints in a complete review of systems (except as listed in HPI above).     Objective:   Physical Exam  BP (!) 173/95 (BP Location: Right Arm, Patient Position: Sitting, Cuff Size: Large)   Pulse 85   Temp (!) 97.3 F (36.3 C) (Temporal)   Ht 5' 1.5" (1.562 m)   Wt 209 lb (94.8 kg)   LMP 08/26/2018   SpO2 99%   BMI 38.85 kg/m   Wt Readings from Last 3 Encounters:  11/08/20 195 lb (88.5 kg)  05/10/20 181 lb  4 oz (82.2 kg)  10/13/19 197 lb 6 oz (89.5 kg)    General: Appears her stated age, obese, in NAD. Skin: Warm, dry and intact.  Psoriasis noted of scalp. HEENT: Head: normal shape and size; Eyes: sclera white, no icterus, conjunctiva pink, PERRLA and EOMs intact;  Neck:  Neck supple, trachea midline. No masses, lumps or thyromegaly present.  Cardiovascular: Normal rate and rhythm. S1,S2 noted.  No murmur, rubs or gallops noted. No JVD or BLE edema. No carotid bruits noted. Pulmonary/Chest: Normal effort and positive vesicular breath sounds. No respiratory distress. No wheezes, rales or ronchi noted.  Abdomen: Soft and nontender. Normal bowel sounds.  Musculoskeletal: Strength 5/5 BUE/BLE.  No difficulty with gait.  Neurological: Alert and oriented. Cranial nerves II-XII grossly intact. Coordination normal.  Psychiatric: Mood and affect normal. Behavior is normal. Judgment and thought content normal.    BMET    Component Value Date/Time   NA 143 11/08/2020 0925   K 4.8 11/08/2020 0925   CL 104 11/08/2020 0925   CO2 24 11/08/2020 0925   GLUCOSE 96 11/08/2020 0925   GLUCOSE 106 (H) 10/06/2019 0911   BUN 12 11/08/2020 0925   CREATININE 0.75 11/08/2020 0925   CALCIUM 9.6 11/08/2020 0925   GFRNONAA 86 05/10/2020 0840   GFRAA 99 05/10/2020 0840    Lipid Panel     Component Value Date/Time   CHOL 190 05/10/2020 0840   TRIG 232 (H) 05/10/2020 0840   HDL 46 05/10/2020 0840   CHOLHDL 4.1 05/10/2020 0840   CHOLHDL 6 10/06/2019 0911   VLDL 62.2 (H) 10/06/2019 0911   LDLCALC 104 (H) 05/10/2020 0840    CBC    Component Value Date/Time   WBC 7.8 10/06/2019 0911   RBC 4.35 10/06/2019 0911   HGB 14.7 10/06/2019 0911   HCT 42.6 10/06/2019 0911   PLT 267.0 10/06/2019 0911   MCV 98.1 10/06/2019 0911   MCH 21.9 (L) 02/19/2019 1004   MCHC 34.4 10/06/2019 0911   RDW 14.1 10/06/2019 0911    Hgb A1C Lab Results  Component Value Date   HGBA1C 5.0 10/06/2019            Assessment & Plan:   Preventative Health Maintenance:  Encouraged her to get a flu shot in the fall Tetanus UTD Encouraged her to get her COVID-vaccine Discussed Shingrix vaccine, she will check coverage with her insurance company and schedule a nurse visit if she would like to have this done Pap smear due but she does not want to have this done today Mammogram ordered-she will call to schedule Cologuard ordered Encouraged her to consume a balanced diet and exercise regimen Advised her to see an eye doctor and dentist annually We will check CBC, c-Met, lipid, A1c and hep C today  RTC in 6 months, follow-up chronic conditions  Webb Silversmith,  NP

## 2021-08-18 DIAGNOSIS — E6609 Other obesity due to excess calories: Secondary | ICD-10-CM | POA: Insufficient documentation

## 2021-08-18 DIAGNOSIS — L409 Psoriasis, unspecified: Secondary | ICD-10-CM | POA: Insufficient documentation

## 2021-08-18 LAB — COMPLETE METABOLIC PANEL WITH GFR
AG Ratio: 1.6 (calc) (ref 1.0–2.5)
ALT: 27 U/L (ref 6–29)
AST: 24 U/L (ref 10–35)
Albumin: 4.3 g/dL (ref 3.6–5.1)
Alkaline phosphatase (APISO): 72 U/L (ref 37–153)
BUN: 12 mg/dL (ref 7–25)
CO2: 24 mmol/L (ref 20–32)
Calcium: 9.6 mg/dL (ref 8.6–10.4)
Chloride: 103 mmol/L (ref 98–110)
Creat: 0.73 mg/dL (ref 0.50–1.03)
Globulin: 2.7 g/dL (calc) (ref 1.9–3.7)
Glucose, Bld: 89 mg/dL (ref 65–99)
Potassium: 4 mmol/L (ref 3.5–5.3)
Sodium: 139 mmol/L (ref 135–146)
Total Bilirubin: 0.7 mg/dL (ref 0.2–1.2)
Total Protein: 7 g/dL (ref 6.1–8.1)
eGFR: 98 mL/min/{1.73_m2} (ref >=60)

## 2021-08-18 LAB — CBC
HCT: 41.1 % (ref 35.0–45.0)
Hemoglobin: 13.7 g/dL (ref 11.7–15.5)
MCH: 31.4 pg (ref 27.0–33.0)
MCHC: 33.3 g/dL (ref 32.0–36.0)
MCV: 94.3 fL (ref 80.0–100.0)
MPV: 10.7 fL (ref 7.5–12.5)
Platelets: 248 10*3/uL (ref 140–400)
RBC: 4.36 10*6/uL (ref 3.80–5.10)
RDW: 13.9 % (ref 11.0–15.0)
WBC: 8.4 10*3/uL (ref 3.8–10.8)

## 2021-08-18 LAB — HEPATITIS C ANTIBODY
Hepatitis C Ab: NONREACTIVE
SIGNAL TO CUT-OFF: 0.07 (ref <1.00)

## 2021-08-18 LAB — LIPID PANEL
Cholesterol: 265 mg/dL — ABNORMAL HIGH (ref <200)
HDL: 44 mg/dL — ABNORMAL LOW (ref >=50)
LDL Cholesterol (Calc): 171 mg/dL (calc) — ABNORMAL HIGH
Non-HDL Cholesterol (Calc): 221 mg/dL (calc) — ABNORMAL HIGH (ref <130)
Total CHOL/HDL Ratio: 6 (calc) — ABNORMAL HIGH (ref <5.0)
Triglycerides: 293 mg/dL — ABNORMAL HIGH (ref <150)

## 2021-08-18 LAB — HEMOGLOBIN A1C
Hgb A1c MFr Bld: 5.4 % of total Hgb (ref <5.7)
Mean Plasma Glucose: 108 mg/dL
eAG (mmol/L): 6 mmol/L

## 2021-08-18 MED ORDER — ROSUVASTATIN CALCIUM 20 MG PO TABS: 20.0000 mg | ORAL_TABLET | Freq: Every day | ORAL | 1 refills | Status: DC

## 2021-08-18 MED ORDER — SEMAGLUTIDE-WEIGHT MANAGEMENT 0.25 MG/0.5ML ~~LOC~~ SOAJ: 0.2500 mg | SUBCUTANEOUS | 0 refills | Status: DC

## 2021-08-18 MED ORDER — INSULIN PEN NEEDLE 31G X 5 MM MISC: 0 refills | Status: DC

## 2021-08-18 NOTE — Assessment & Plan Note (Signed)
Continue Entresto per cardiology

## 2021-08-18 NOTE — Assessment & Plan Note (Signed)
She is interested in trying [facility] pending labs Encourage low-carb diet and exercise for weight loss

## 2021-08-18 NOTE — Addendum Note (Signed)
Addended by: Lorre Munroe on: 08/18/2021 10:31 AM   Modules accepted: Orders

## 2021-08-18 NOTE — Assessment & Plan Note (Signed)
Uncontrolled We will check renal artery ultrasound to rule out renal artery stenosis Continue Entresto per cardiology

## 2021-08-18 NOTE — Patient Instructions (Signed)

## 2021-08-18 NOTE — Addendum Note (Signed)
Addended by: Lorre Munroe on: 08/18/2021 10:59 AM   Modules accepted: Orders

## 2021-08-29 ENCOUNTER — Encounter: Payer: Self-pay | Admitting: Internal Medicine

## 2021-09-04 ENCOUNTER — Ambulatory Visit: Payer: Managed Care, Other (non HMO)

## 2021-10-21 ENCOUNTER — Other Ambulatory Visit: Payer: Self-pay | Admitting: Cardiovascular Disease

## 2021-11-09 ENCOUNTER — Other Ambulatory Visit: Payer: Self-pay | Admitting: Cardiovascular Disease

## 2021-12-15 ENCOUNTER — Ambulatory Visit: Payer: Managed Care, Other (non HMO) | Admitting: Medical

## 2022-01-01 ENCOUNTER — Ambulatory Visit: Payer: Managed Care, Other (non HMO) | Admitting: Medical

## 2022-01-01 NOTE — Progress Notes (Deleted)
Cardiology Office Note:    Date:  01/01/2022   ID:  Rebecca Bennett, DOB [birth year], MRN [id number]  PCP:  [name], NP  [facility] Cardiologist:  None  [facility] Electrophysiologist:  None   Referring MD: [name], NP   Chief Complaint: 6 month follow-up  History of Present Illness:    Rebecca Bennett is a 56 y.o. female with a hx of HTN, dilated CM presumed NICM who presents for follow-up.   The patient presented to the ER 01/2019 with lower leg edema and elevated BP. Echo showed LVEF 30-35%. Myoview Lexiscan showed no significant ischemia, normal wall motion, no significant coronary calcification or aortic atherosclerosis, overall low risk scan. Cardiomyopathy presumed NICM. Echo in December 2021 showed improved LVEF.   Last seen 10/2020 and was stable from a cardiac perspective.  Today,   No past medical history on file.  No past surgical history on file.  Current Medications: No outpatient medications have been marked as taking for the 01/01/22 encounter (Appointment) with Kathlen Mody, Deandrew Hoecker H, PA-C.     Allergies:   Patient has no known allergies.   Social History   Socioeconomic History   Marital status: Widowed    Spouse name: Not on file   Number of children: Not on file   Years of education: Not on file   Highest education level: Not on file  Occupational History   Not on file  Tobacco Use   Smoking status: Never   Smokeless tobacco: Never  Vaping Use   Vaping Use: Never used  Substance and Sexual Activity   Alcohol use: Yes    Comment: occasional   Drug use: Never   Sexual activity: Not on file  Other Topics Concern   Not on file  Social History Narrative   Not on file   Social Determinants of Health   Financial Resource Strain: Not on file  Food Insecurity: Not on file  Transportation Needs: Not on file  Physical Activity: Not on file  Stress: Not on file  Social Connections: Not on file     Family  History: The patient's ***family history includes Breast cancer in her maternal grandmother and mother; Hyperlipidemia in her maternal grandmother and mother; Hypertension in her maternal grandmother and mother; Prostate cancer in her father.  ROS:   Please see the history of present illness.    *** All other systems reviewed and are negative.  EKGs/Labs/Other Studies Reviewed:    The following studies were reviewed today: ***  EKG:  EKG is *** ordered today.  The ekg ordered today demonstrates ***  Recent Labs: 08/17/2021: ALT 27; BUN 12; Creat 0.73; Hemoglobin 13.7; Platelets 248; Potassium 4.0; Sodium 139  Recent Lipid Panel    Component Value Date/Time   CHOL 265 (H) 08/17/2021 1616   CHOL 190 05/10/2020 0840   TRIG 293 (H) 08/17/2021 1616   HDL 44 (L) 08/17/2021 1616   HDL 46 05/10/2020 0840   CHOLHDL 6.0 (H) 08/17/2021 1616   VLDL 62.2 (H) 10/06/2019 0911   LDLCALC 171 (H) 08/17/2021 1616   LDLDIRECT 189.0 10/06/2019 0911     Risk Assessment/Calculations:   {Does this patient have ATRIAL FIBRILLATION?[phone]}   Physical Exam:    VS:  LMP 08/26/2018     Wt Readings from Last 3 Encounters:  08/17/21 209 lb (94.8 kg)  11/08/20 195 lb (88.5 kg)  05/10/20 181 lb 4 oz (82.2 kg)     GEN: *** Well  nourished, well developed in no acute distress HEENT: Normal NECK: No JVD; No carotid bruits LYMPHATICS: No lymphadenopathy CARDIAC: ***RRR, no murmurs, rubs, gallops RESPIRATORY:  Clear to auscultation without rales, wheezing or rhonchi  ABDOMEN: Soft, non-tender, non-distended MUSCULOSKELETAL:  No edema; No deformity  SKIN: Warm and dry NEUROLOGIC:  Alert and oriented x 3 PSYCHIATRIC:  Normal affect   ASSESSMENT:    No diagnosis found. PLAN:    In order of problems listed above:  ***  Disposition: Follow up {follow up:15908} with ***   Shared Decision Making/Informed Consent   {Are you ordering a CV Procedure (e.g. stress test, cath, DCCV, TEE, etc)?    Press F2        :[id number]}    Signed, Farhad Burleson David Stall, PA-C  01/01/2022 7:35 AM    [facility]

## 2022-01-24 ENCOUNTER — Ambulatory Visit: Payer: Managed Care, Other (non HMO) | Admitting: Medical

## 2022-01-24 NOTE — Progress Notes (Deleted)
Cardiology Office Note:    Date:  01/24/2022   ID:  Rebecca Bennett, DOB 1965-05-22, MRN [id number]  PCP:  Lorre Munroe, NP  [facility] Cardiologist:  None  [facility] Electrophysiologist:  None   Referring MD: Lorre Munroe, NP   Chief Complaint: 8 month follow-up  History of Present Illness:    Rebecca Bennett is a 56 y.o. female with a hx of HTN, dilated CM, HTN, HFrEF, NICM who presents for follow-up.   Echo in 02/2020 showed LVEF 60-65%, no WMA.   Echo January 2021 showed reduced LVEF 30-35% in the setting of losing her husband and daughter leaving for college.  Myoview Lexi scan at that time showed no significant ischemia, normal perfusion, EF 51%, CT coronary calcium scoring with no significant aortic atherosclerosis or coronary calcification, overall low risk scan.  Last seen 10/2020 and was stable from a cardiac perspective.   No past medical history on file.  No past surgical history on file.  Current Medications: No outpatient medications have been marked as taking for the 01/24/22 encounter (Appointment) with Fransico Michael, Krrish Freund H, PA-C.     Allergies:   Patient has no known allergies.   Social History   Socioeconomic History   Marital status: Widowed    Spouse name: Not on file   Number of children: Not on file   Years of education: Not on file   Highest education level: Not on file  Occupational History   Not on file  Tobacco Use   Smoking status: Never   Smokeless tobacco: Never  Vaping Use   Vaping Use: Never used  Substance and Sexual Activity   Alcohol use: Yes    Comment: occasional   Drug use: Never   Sexual activity: Not on file  Other Topics Concern   Not on file  Social History Narrative   Not on file   Social Determinants of Health   Financial Resource Strain: Not on file  Food Insecurity: Not on file  Transportation Needs: Not on file  Physical Activity: Not on file  Stress: Not on file  Social Connections:  Not on file     Family History: The patient's family history includes Breast cancer in her maternal grandmother and mother; Hyperlipidemia in her maternal grandmother and mother; Hypertension in her maternal grandmother and mother; Prostate cancer in her father.  ROS:   Please see the history of present illness.     All other systems reviewed and are negative.  EKGs/Labs/Other Studies Reviewed:    The following studies were reviewed today:  Echo 02/2020  1. Left ventricular ejection fraction, by estimation, is 60 to 65%. The  left ventricle has normal function. The left ventricle has no regional  wall motion abnormalities. Left ventricular diastolic parameters were  normal. The average left ventricular  global longitudinal strain is -15.9 %. The global longitudinal strain is  normal.   2. Right ventricular systolic function is normal. The right ventricular  size is normal. There is normal pulmonary artery systolic pressure. The  estimated right ventricular systolic pressure is 31.5 mmHg.   Myoview Lexiscan 07/14/2019 Pharmacological myocardial perfusion imaging study with no significant  ischemia Normal wall motion, EF estimated at 51% Equivocal EKG changes , less than 1 mm at peak stress CT coronary calcium scoring with no significant aortic atherosclerosis or coronary calcification Baseline hypertension noted, systolic pressures 160s Low risk scan  Echo 04/15/2019  1. Left ventricular ejection fraction, by visual estimation,  is 30 to  35%. The left ventricle has severely decreased function. There is  moderately increased left ventricular hypertrophy.   2. Severe hypokinesis of the left ventricular, entire anteroseptal wall.   3. Indeterminate diastolic filling due to E-A fusion.   4. Mildly dilated left ventricular internal cavity size.   5. The left ventricle demonstrates global hypokinesis.   6. Global right ventricle has low normal systolic function.The right  ventricular  size is moderately enlarged. No increase in right ventricular  wall thickness.   7. Left atrial size was mildly dilated.   8. Right atrial size was mildly dilated.   9. The pericardial effusion is anterior to the right ventricle.  10. Trivial pericardial effusion is present.  11. The mitral valve is normal in structure. Mild to moderate mitral valve  regurgitation.  12. The tricuspid valve is normal in structure.  13. The tricuspid valve is normal in structure. Tricuspid valve  regurgitation is mild-moderate.  14. The aortic valve is tricuspid. Aortic valve regurgitation is not  visualized. No evidence of aortic valve sclerosis or stenosis.  15. Pulmonic regurgitation is mild.  16. The pulmonic valve was abnormal. Pulmonic valve regurgitation is mild.  17. Moderately elevated pulmonary artery systolic pressure.  18. The inferior vena cava is normal in size with greater than 50%  respiratory variability, suggesting right atrial pressure of 3 mmHg.  19. The interatrial septum was not well visualized.   EKG:  EKG is *** ordered today.  The ekg ordered today demonstrates ***  Recent Labs: 08/17/2021: ALT 27; BUN 12; Creat 0.73; Hemoglobin 13.7; Platelets 248; Potassium 4.0; Sodium 139  Recent Lipid Panel    Component Value Date/Time   CHOL 265 (H) 08/17/2021 1616   CHOL 190 05/10/2020 0840   TRIG 293 (H) 08/17/2021 1616   HDL 44 (L) 08/17/2021 1616   HDL 46 05/10/2020 0840   CHOLHDL 6.0 (H) 08/17/2021 1616   VLDL 62.2 (H) 10/06/2019 0911   LDLCALC 171 (H) 08/17/2021 1616   LDLDIRECT 189.0 10/06/2019 0911     Risk Assessment/Calculations:   {Does this patient have ATRIAL FIBRILLATION?[phone]}   Physical Exam:    VS:  LMP 08/26/2018     Wt Readings from Last 3 Encounters:  08/17/21 209 lb (94.8 kg)  11/08/20 195 lb (88.5 kg)  05/10/20 181 lb 4 oz (82.2 kg)     GEN: *** Well nourished, well developed in no acute distress HEENT: Normal NECK: No JVD; No carotid  bruits LYMPHATICS: No lymphadenopathy CARDIAC: ***RRR, no murmurs, rubs, gallops RESPIRATORY:  Clear to auscultation without rales, wheezing or rhonchi  ABDOMEN: Soft, non-tender, non-distended MUSCULOSKELETAL:  No edema; No deformity  SKIN: Warm and dry NEUROLOGIC:  Alert and oriented x 3 PSYCHIATRIC:  Normal affect   ASSESSMENT:    No diagnosis found. PLAN:    In order of problems listed above:  ***  Disposition: Follow up {follow up:15908} with ***   Shared Decision Making/Informed Consent   {Are you ordering a CV Procedure (e.g. stress test, cath, DCCV, TEE, etc)?   Press F2        :[id number]}    Signed, Bela Bonaparte Ninfa Meeker, PA-C  01/24/2022 7:32 AM    [facility]

## 2022-01-27 NOTE — Progress Notes (Unsigned)
Cardiology Office Note    Date:  02/01/2022   ID:  Rebecca Bennett, DOB 1966/01/19, MRN [id number]  PCP:  [name], NP  Cardiologist:  Ida Rogue, MD  Electrophysiologist:  None   Chief Complaint: Follow-up  History of Present Illness:   Rebecca Bennett is a 56 y.o. female with history of HFrEF presumed to be NICM with subsequent normalization of LV systolic function by echo in 02/2020, hypertensive heart disease, and hyperlipidemia who presents for follow-up of her cardiomyopathy.  Echo in 03/2019 demonstrated an EF of 30 to 35%, moderate LVH, severe hypokinesis of the entire anteroseptal wall, indeterminate diastolic filling, mildly dilated LV internal cavity size, low normal RV systolic function with moderately enlarged ventricular cavity size, mild biatrial enlargement, trivial pericardial effusion anterior to the right ventricle, mild to moderate mitral regurgitation, mild to moderate tricuspid regurgitation, mild pulmonic regurgitation, moderately elevated PASP estimated at 47.6 mmHg, and an estimated right atrial pressure of 3 mmHg.  Given cardiomyopathy, she underwent Lexiscan MPI, at the request of her primary cardiologist, which showed no significant ischemia with an EF of 51% with no significant coronary calcification or aortic atherosclerosis noted on CT imaging.  Overall, this was a low risk scan.  Most recent echo from 02/2020 demonstrated an EF of 60 to 65%, no regional wall motion abnormalities, normal LV diastolic function parameters, normal RV systolic function and ventricular cavity size, and normal PASP.  She was last seen in the office in 10/2020 and was without symptoms of angina or decompensation.  Blood pressure remained suboptimally controlled at 154/100.  Metoprolol was transitioned to carvedilol.  She comes in today and is without symptoms of angina or decompensation.  She has been off carvedilol, furosemide, Entresto, and rosuvastatin for the past  3 months.  She indicates her pills did not have refills on them.  She consumes a diet high in sodium, not watching her salt intake.  She reports that she is a "salt-aholic."  She drinks an occasional glass of wine socially with friends.  No illicit substances.  No caffeine intake.  She is without symptoms of chest pain, back pain, dyspnea, dizziness, presyncope, or syncope.  She does occasionally note palpitations with heart rates in the 1-teens at home in this setting.   Labs independently reviewed: 07/2021 - A1c 5.4, TC 265, TG 293, HDL 44, LDL 171, BUN 12, serum creatinine 0.73, potassium 4.0, albumin 4.3, AST/ALT normal, Hgb 13.7, PLT 248 03/2019 - TSH normal  Past Medical History:  Diagnosis Date   HFrEF (heart failure with reduced ejection fraction) ([place])    Hyperlipidemia    Hypertensive heart disease    Nonischemic cardiomyopathy ([place])      History reviewed. No pertinent surgical history.  Current Medications: Current Meds  Medication Sig   losartan (COZAAR) 50 MG tablet Take 1 tablet (50 mg total) by mouth daily.    Allergies:   Patient has no known allergies.   Social History   Socioeconomic History   Marital status: Widowed    Spouse name: Not on file   Number of children: Not on file   Years of education: Not on file   Highest education level: Not on file  Occupational History   Not on file  Tobacco Use   Smoking status: Never   Smokeless tobacco: Never  Vaping Use   Vaping Use: Never used  Substance and Sexual Activity   Alcohol use: Yes    Comment: occasional  Drug use: Never   Sexual activity: Not on file  Other Topics Concern   Not on file  Social History Narrative   Not on file   Social Determinants of Health   Financial Resource Strain: Not on file  Food Insecurity: Not on file  Transportation Needs: Not on file  Physical Activity: Not on file  Stress: Not on file  Social Connections: Not on file     Family History:  The patient's family  history includes Breast cancer in her maternal grandmother and mother; Hyperlipidemia in her maternal grandmother and mother; Hypertension in her maternal grandmother and mother; Prostate cancer in her father.  ROS:   12-point review of systems is negative unless otherwise noted in the HPI.   EKGs/Labs/Other Studies Reviewed:    Studies reviewed were summarized above. The additional studies were reviewed today:  2D echo 03/24/2020: 1. Left ventricular ejection fraction, by estimation, is 60 to 65%. The  left ventricle has normal function. The left ventricle has no regional  wall motion abnormalities. Left ventricular diastolic parameters were  normal. The average left ventricular  global longitudinal strain is -15.9 %. The global longitudinal strain is  normal.   2. Right ventricular systolic function is normal. The right ventricular  size is normal. There is normal pulmonary artery systolic pressure. The  estimated right ventricular systolic pressure is 0000000 mmHg.  __________  Carlton Adam MPI 07/09/2019: Pharmacological myocardial perfusion imaging study with no significant  ischemia Normal wall motion, EF estimated at 51% Equivocal EKG changes , less than 1 mm at peak stress CT coronary calcium scoring with no significant aortic atherosclerosis or coronary calcification Baseline hypertension noted, systolic pressures 123456 Low risk scan __________  2D echo 04/14/2019: 1. Left ventricular ejection fraction, by visual estimation, is 30 to  35%. The left ventricle has severely decreased function. There is  moderately increased left ventricular hypertrophy.   2. Severe hypokinesis of the left ventricular, entire anteroseptal wall.   3. Indeterminate diastolic filling due to E-A fusion.   4. Mildly dilated left ventricular internal cavity size.   5. The left ventricle demonstrates global hypokinesis.   6. Global right ventricle has low normal systolic function.The right  ventricular  size is moderately enlarged. No increase in right ventricular  wall thickness.   7. Left atrial size was mildly dilated.   8. Right atrial size was mildly dilated.   9. The pericardial effusion is anterior to the right ventricle.  10. Trivial pericardial effusion is present.  11. The mitral valve is normal in structure. Mild to moderate mitral valve  regurgitation.  12. The tricuspid valve is normal in structure.  13. The tricuspid valve is normal in structure. Tricuspid valve  regurgitation is mild-moderate.  14. The aortic valve is tricuspid. Aortic valve regurgitation is not  visualized. No evidence of aortic valve sclerosis or stenosis.  15. Pulmonic regurgitation is mild.  16. The pulmonic valve was abnormal. Pulmonic valve regurgitation is mild.  17. Moderately elevated pulmonary artery systolic pressure.  18. The inferior vena cava is normal in size with greater than 50%  respiratory variability, suggesting right atrial pressure of 3 mmHg.  19. The interatrial septum was not well visualized.    EKG:  EKG is ordered today.  The EKG ordered today demonstrates sinus tachycardia, 111 bpm, poor R wave progression along the precordial leads, nonspecific ST-T changes  Recent Labs: 08/17/2021: ALT 27; BUN 12; Creat 0.73; Hemoglobin 13.7; Platelets 248; Potassium 4.0; Sodium 139  Recent  Lipid Panel    Component Value Date/Time   CHOL 265 (H) 08/17/2021 1616   CHOL 190 05/10/2020 0840   TRIG 293 (H) 08/17/2021 1616   HDL 44 (L) 08/17/2021 1616   HDL 46 05/10/2020 0840   CHOLHDL 6.0 (H) 08/17/2021 1616   VLDL 62.2 (H) 10/06/2019 0911   LDLCALC 171 (H) 08/17/2021 1616   LDLDIRECT 189.0 10/06/2019 0911    PHYSICAL EXAM:    VS:  BP (!) 178/108   Pulse (!) 111   Ht 5\' 1"  (1.549 m)   Wt 205 lb 8 oz (93.2 kg)   LMP 08/26/2018   SpO2 99%   BMI 38.83 kg/m   BMI: Body mass index is 38.83 kg/m.  Physical Exam Vitals reviewed.  Constitutional:      Appearance: She is  well-developed.  HENT:     Head: Normocephalic and atraumatic.  Eyes:     General:        Right eye: No discharge.        Left eye: No discharge.  Neck:     Vascular: No JVD.  Cardiovascular:     Rate and Rhythm: Regular rhythm. Tachycardia present.     Pulses:          Posterior tibial pulses are 2+ on the right side and 2+ on the left side.     Heart sounds: Normal heart sounds, S1 normal and S2 normal. Heart sounds not distant. No midsystolic click and no opening snap. No murmur heard.    No friction rub.  Pulmonary:     Effort: Pulmonary effort is normal. No respiratory distress.     Breath sounds: Normal breath sounds. No decreased breath sounds, wheezing or rales.  Chest:     Chest wall: No tenderness.  Abdominal:     General: There is no distension.  Musculoskeletal:     Cervical back: Normal range of motion.     Right lower leg: No edema.     Left lower leg: No edema.  Skin:    General: Skin is warm and dry.     Nails: There is no clubbing.  Neurological:     Mental Status: She is alert and oriented to person, place, and time.  Psychiatric:        Speech: Speech normal.        Behavior: Behavior normal.        Thought Content: Thought content normal.        Judgment: Judgment normal.     Wt Readings from Last 3 Encounters:  02/01/22 205 lb 8 oz (93.2 kg)  08/17/21 209 lb (94.8 kg)  11/08/20 195 lb (88.5 kg)     ASSESSMENT & PLAN:   HFrEF secondary to presumed NICM: Euvolemic. She has been off GDMT for the past 3 months as outlined below.  Start carvedilol 12.5 mg twice daily and losartan 50 mg daily with recommendation to titrate and escalate GDMT moving forward with ultimate plan to transition back to Entresto from losartan once her blood pressure is under better control.  Hypertensive heart disease: Blood pressure very poorly controlled in the office this afternoon with a triage blood pressure of 200/114 in the context of not having taken medications for 3  months, and with high sodium diet.  Patient given clonidine 0.1 mg in the office with repeat blood pressure of 180/110 20 minutes postmedication administration.  Patient remained asymptomatic.  Patient given another 0.1 mg clonidine with repeat blood pressure 178/108.  She  wants to avoid ED evaluation.  Start carvedilol 12.5 mg twice daily and losartan 50 mg daily with recommendation to escalate medical therapy and GDMT moving forward.  She was advised to start carvedilol and losartan today.  Recommend low-sodium diet.  Obtain TSH, CBC, and CMP.  Once blood pressure is under better control, schedule renal artery ultrasound to evaluate for renal artery stenosis.  May benefit from ongoing hypertensive work-up with sleep study if not already completed as well as renin aldosterone ratio as well as catecholamines/metanephrines pending BP trend back on medical therapy.  HLD: LDL 171 in 07/2021.  Not currently on statin therapy.  Medication nonadherence: Ran out of medications 3 months prior.  [facility] told her her pills did not have refills.  No documentation of pharmacy/patient contacting our office for refills.  Recommend patient contact our office if she has medication refill issues moving forward.  Discussed with DOD.   Disposition: F/u with Dr. Rockey Situ or an APP in 2 weeks.   Medication Adjustments/Labs and Tests Ordered: Current medicines are reviewed at length with the patient today.  Concerns regarding medicines are outlined above. Medication changes, Labs and Tests ordered today are summarized above and listed in the Patient Instructions accessible in Encounters.   Signed, Christell Faith, PA-C 02/01/2022 4:37 PM     [facility] [address] [place], [place] [address] [phone]

## 2022-02-01 ENCOUNTER — Encounter: Payer: Self-pay | Admitting: Physician Assistant

## 2022-02-01 ENCOUNTER — Ambulatory Visit: Payer: Managed Care, Other (non HMO) | Attending: Medical | Admitting: Physician Assistant

## 2022-02-01 ENCOUNTER — Other Ambulatory Visit
Admission: RE | Admit: 2022-02-01 | Discharge: 2022-02-01 | Disposition: A | Discharge status: Home / Self Care | Payer: Managed Care, Other (non HMO) | Source: Ambulatory Visit | Attending: Physician Assistant | Admitting: Physician Assistant

## 2022-02-01 VITALS — BP 178/108 | HR 111 | Ht 61.0 in | Wt 205.5 lb

## 2022-02-01 DIAGNOSIS — I110 Hypertensive heart disease with heart failure: Secondary | ICD-10-CM | POA: Diagnosis present

## 2022-02-01 DIAGNOSIS — I5022 Chronic systolic (congestive) heart failure: Secondary | ICD-10-CM

## 2022-02-01 DIAGNOSIS — I420 Dilated cardiomyopathy: Secondary | ICD-10-CM

## 2022-02-01 DIAGNOSIS — I428 Other cardiomyopathies: Secondary | ICD-10-CM | POA: Diagnosis not present

## 2022-02-01 LAB — COMPREHENSIVE METABOLIC PANEL
ALT: 47 U/L — ABNORMAL HIGH (ref 0–44)
AST: 39 U/L (ref 15–41)
Albumin: 4.2 g/dL (ref 3.5–5.0)
Alkaline Phosphatase: 72 U/L (ref 38–126)
Anion gap: 10 (ref 5–15)
BUN: 11 mg/dL (ref 6–20)
CO2: 23 mmol/L (ref 22–32)
Calcium: 9.5 mg/dL (ref 8.9–10.3)
Chloride: 104 mmol/L (ref 98–111)
Creatinine, Ser: 0.67 mg/dL (ref 0.44–1.00)
GFR, Estimated: >60 mL/min (ref >60)
Glucose, Bld: 108 mg/dL — ABNORMAL HIGH (ref 70–99)
Potassium: 3.7 mmol/L (ref 3.5–5.1)
Sodium: 137 mmol/L (ref 135–145)
Total Bilirubin: 1 mg/dL (ref 0.3–1.2)
Total Protein: 7.3 g/dL (ref 6.5–8.1)

## 2022-02-01 LAB — CBC
HCT: 41.6 % (ref 36.0–46.0)
Hemoglobin: 14.2 g/dL (ref 12.0–15.0)
MCH: 31.2 pg (ref 26.0–34.0)
MCHC: 34.1 g/dL (ref 30.0–36.0)
MCV: 91.4 fL (ref 80.0–100.0)
Platelets: 238 10*3/uL (ref 150–400)
RBC: 4.55 MIL/uL (ref 3.87–5.11)
RDW: 13.8 % (ref 11.5–15.5)
WBC: 11.4 10*3/uL — ABNORMAL HIGH (ref 4.0–10.5)
nRBC: 0 % (ref 0.0–0.2)

## 2022-02-01 LAB — TSH: TSH: 2.137 u[IU]/mL (ref 0.350–4.500)

## 2022-02-01 MED ORDER — CLONIDINE HCL 0.1 MG PO TABS
0.1000 mg | ORAL_TABLET | Freq: Once | ORAL | Status: AC
  Administered 2022-02-01: 0.1 mg via ORAL

## 2022-02-01 MED ORDER — LOSARTAN POTASSIUM 50 MG PO TABS: 50.0000 mg | ORAL_TABLET | Freq: Every day | ORAL | 0 refills | Status: DC

## 2022-02-01 MED ORDER — CARVEDILOL 12.5 MG PO TABS: 12.5000 mg | ORAL_TABLET | Freq: Two times a day (BID) | ORAL | 0 refills | Status: DC

## 2022-02-01 NOTE — Patient Instructions (Addendum)
Medication Instructions:  START Carvedilol 12.5 mg twice daily START Losartan 50 mg once daily  *If you need a refill on your cardiac medications before your next appointment, please call your pharmacy*   Lab Work: Your provider would like for you to have the following labs drawn: CBC, CMET and TSH.   Please go to the [facility] entrance and check in at the front desk.  You do not need an appointment.  They are open from 7am-6 pm.  You will not need to be fasting.  If you have labs (blood work) drawn today and your tests are completely normal, you will receive your results only by: MyChart Message (if you have MyChart) OR A paper copy in the mail If you have any lab test that is abnormal or we need to change your treatment, we will call you to review the results.   Testing/Procedures: None ordered   Follow-Up: At [facility], you and your health needs are our priority.  As part of our continuing mission to provide you with exceptional heart care, we have created designated Provider Care Teams.  These Care Teams include your primary Cardiologist (physician) and Advanced Practice Providers (APPs -  Physician Assistants and Nurse Practitioners) who all work together to provide you with the care you need, when you need it.  We recommend signing up for the patient portal called "MyChart".  Sign up information is provided on this After Visit Summary.  MyChart is used to connect with patients for Virtual Visits (Telemedicine).  Patients are able to view lab/test results, encounter notes, upcoming appointments, etc.  Non-urgent messages can be sent to your provider as well.   To learn more about what you can do with MyChart, go to [email or web address].    Your next appointment:   2 week(s)  The format for your next appointment:   In Person  Provider:   Eula Listen, PA

## 2022-02-06 ENCOUNTER — Telehealth: Payer: Self-pay | Admitting: *Deleted

## 2022-02-06 NOTE — Telephone Encounter (Signed)
No answer/Voicemail box is full.  

## 2022-02-06 NOTE — Telephone Encounter (Signed)
-----   Message from Sondra Barges, PA-C sent at 02/02/2022  7:13 AM EST ----- Potassium low normal Renal function normal Random glucose okay 1 LFT is mildly elevated  WBC count mildly elevated Thyroid function normal  Recommendations: -Continue with initiation of antihypertensive therapy as discussed at office visit with close follow-up -No obvious sign of infection at office visit, leukocytosis may be inflammatory given her elevated BP, this can be trended down the road

## 2022-02-07 NOTE — Telephone Encounter (Signed)
No answer/Voicemail box is full.  

## 2022-02-08 ENCOUNTER — Encounter: Payer: Self-pay | Admitting: *Deleted

## 2022-02-08 NOTE — Telephone Encounter (Signed)
No answer/Voicemail box is full. Sending patient letter with results, appointment information, with instructions to call back if questions.

## 2022-02-13 ENCOUNTER — Ambulatory Visit: Payer: BC Managed Care – PPO | Admitting: Cardiovascular Disease

## 2022-02-19 NOTE — Progress Notes (Deleted)
Cardiology Office Note    Date:  02/19/2022   ID:  [name], DOB 25-Dec-1965, MRN [id number]  PCP:  Lorre Munroe, NP  Cardiologist:  Julien Nordmann, MD  Electrophysiologist:  None   Chief Complaint: Follow-up  History of Present Illness:   Rebecca Bennett is a 56 y.o. 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   Echo in 03/2019 demonstrated an EF of 30 to 35%, moderate LVH, severe hypokinesis of the entire anteroseptal wall, indeterminate diastolic filling, mildly dilated LV internal cavity size, low normal RV systolic function with moderately enlarged ventricular cavity size, mild biatrial enlargement, trivial pericardial effusion anterior to the right ventricle, mild to moderate mitral regurgitation, mild to moderate tricuspid regurgitation, mild pulmonic regurgitation, moderately elevated PASP estimated at 47.6 mmHg, and an estimated right atrial pressure of 3 mmHg.  Given cardiomyopathy, she underwent Lexiscan MPI, at the request of her primary cardiologist, which showed no significant ischemia with an EF of 51% with no significant coronary calcification or aortic atherosclerosis noted on CT imaging.  Overall, this was a low risk scan.  Most recent echo from 02/2020 demonstrated an EF of 60 to 65%, no regional wall motion abnormalities, normal LV diastolic function parameters, normal RV systolic function and ventricular cavity size, and normal PASP.  She was last seen in the office on 02/01/2022 and was without symptoms of angina or decompensation.  She had been out of her medications for approximately 3 months and consumed a diet high in salt.  Blood pressure was significantly elevated in the office with an initial reading of 200/114 subsequently improved to 178/108  following 2 doses of clonidine 0.1 mg.  She was started on carvedilol and losartan with recommendation for close follow-up.  ***   Labs independently reviewed: 01/2022 - Hgb 14.2, PLT 238, TSH normal, potassium 3.7, BUN 11, serum creatinine 0.67, albumin 4.2, AST normal, ALT 47 07/2021 - A1c 5.4, TC 265, TG 293, HDL 44, LDL 171   Past Medical History:  Diagnosis Date   HFrEF (heart failure with reduced ejection fraction) ([facility])    Hyperlipidemia    Hypertensive heart disease    Nonischemic cardiomyopathy ([facility])     No past surgical history on file.  Current Medications: No outpatient medications have been marked as taking for the 02/22/22 encounter (Appointment) with Sondra Barges, PA-C.    Allergies:   Patient has no known allergies.   Social History   Socioeconomic History   Marital status: Widowed    Spouse name: Not on file   Number of children: Not on file   Years of education: Not on file   Highest education level: Not on file  Occupational History   Not on file  Tobacco Use   Smoking status: Never   Smokeless tobacco: Never  Vaping Use   Vaping Use: Never used  Substance and Sexual Activity   Alcohol use: Yes    Comment: occasional   Drug use: Never   Sexual activity: Not on file  Other Topics Concern   Not on file  Social History Narrative   Not on file   Social Determinants of Health   Financial Resource Strain: Not on file  Food Insecurity: Not on file  Transportation Needs: Not on file  Physical Activity: Not on file  Stress: Not on file  Social Connections: Not on file     Family History:  The patient's family history includes Breast cancer in her maternal grandmother and mother; Hyperlipidemia in her maternal grandmother and mother; Hypertension in her maternal grandmother and mother; Prostate cancer in her father.  ROS:   ROS   EKGs/Labs/Other Studies Reviewed:    Studies reviewed were summarized above. The additional studies were  reviewed today:  2D echo 03/24/2020: 1. Left ventricular ejection fraction, by estimation, is 60 to 65%. The  left ventricle has normal function. The left ventricle has no regional  wall motion abnormalities. Left ventricular diastolic parameters were  normal. The average left ventricular  global longitudinal strain is -15.9 %. The global longitudinal strain is  normal.   2. Right ventricular systolic function is normal. The right ventricular  size is normal. There is normal pulmonary artery systolic pressure. The  estimated right ventricular systolic pressure is 31.5 mmHg.  __________   Eugenie Birks MPI 07/09/2019: Pharmacological myocardial perfusion imaging study with no significant  ischemia Normal wall motion, EF estimated at 51% Equivocal EKG changes , less than 1 mm at peak stress CT coronary calcium scoring with no significant aortic atherosclerosis or coronary calcification Baseline hypertension noted, systolic pressures 160s Low risk scan __________   2D echo 04/14/2019: 1. Left ventricular ejection fraction, by visual estimation, is 30 to  35%. The left ventricle has severely decreased function. There is  moderately increased left ventricular hypertrophy.   2. Severe hypokinesis of the left ventricular, entire anteroseptal wall.   3. Indeterminate diastolic filling due to E-A fusion.   4. Mildly dilated left ventricular internal cavity size.   5. The left ventricle demonstrates global hypokinesis.   6. Global right ventricle has low normal systolic function.The right  ventricular size is moderately enlarged. No increase in right ventricular  wall thickness.   7. Left atrial size was mildly dilated.   8. Right atrial size was mildly dilated.   9. The pericardial effusion is anterior to the right ventricle.  10. Trivial pericardial effusion is present.  11. The mitral valve is normal in structure. Mild to moderate mitral valve  regurgitation.  12. The tricuspid valve is  normal in structure.  13. The tricuspid valve is normal in structure. Tricuspid valve  regurgitation is mild-moderate.  14. The aortic valve is tricuspid. Aortic valve regurgitation is not  visualized. No evidence of aortic valve sclerosis or stenosis.  15. Pulmonic regurgitation is mild.  16. The pulmonic valve was abnormal. Pulmonic valve regurgitation is mild.  17. Moderately elevated pulmonary artery systolic pressure.  18. The inferior vena cava is normal in size with greater than 50%  respiratory variability, suggesting right atrial pressure of 3 mmHg.  19. The interatrial septum was not well visualized.    EKG:  EKG is ordered today.  The EKG ordered today demonstrates ***  Recent Labs: 02/01/2022: ALT 47; BUN 11; Creatinine, Ser 0.67; Hemoglobin 14.2; Platelets 238; Potassium 3.7; Sodium 137; TSH 2.137  Recent Lipid Panel    Component Value Date/Time   CHOL 265 (H) 08/17/2021 1616   CHOL 190 05/10/2020 0840   TRIG 293 (H) 08/17/2021 1616   HDL 44 (L) 08/17/2021 1616   HDL 46 05/10/2020 0840   CHOLHDL 6.0 (H) 08/17/2021 1616   VLDL 62.2 (H) 10/06/2019 0911   LDLCALC 171 (H) 08/17/2021 1616   LDLDIRECT 189.0 10/06/2019 0911    PHYSICAL EXAM:    VS:  LMP 08/26/2018  BMI: There is no height or weight on file to calculate BMI.  Physical Exam  Wt Readings from Last 3 Encounters:  02/01/22 205 lb 8 oz (93.2 kg)  08/17/21 209 lb (94.8 kg)  11/08/20 195 lb (88.5 kg)     ASSESSMENT & PLAN:   HFrEF secondary to presumed NICM:  Hypertensive heart disease: Blood pressure  HLD: LDL 171 in 07/2021.  ***   {Are you ordering a CV Procedure (e.g. stress test, cath, DCCV, TEE, etc)?   Press F2        :[id number]}     Disposition: F/u with Dr. Mariah Milling or an APP in ***.   Medication Adjustments/Labs and Tests Ordered: Current medicines are reviewed at length with the patient today.  Concerns regarding medicines are outlined above. Medication changes, Labs and Tests  ordered today are summarized above and listed in the Patient Instructions accessible in Encounters.   Signed, Eula Listen, PA-C 02/19/2022 11:39 AM     [facility] [place] [address] [place], [place] [address] [phone]

## 2022-02-22 ENCOUNTER — Ambulatory Visit: Payer: Managed Care, Other (non HMO) | Admitting: Physician Assistant

## 2022-02-27 NOTE — Progress Notes (Unsigned)
Cardiology Office Note    Date:  02/27/2022   ID:  Vicki Chaffin, DOB [birth year], MRN [id number]  PCP:  Lorre Munroe, NP  Cardiologist:  Julien Nordmann, MD  Electrophysiologist:  None   Chief Complaint: Follow-up  History of Present Illness:   Rebecca Bennett is a 56 y.o. 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   Echo in 03/2019 demonstrated an EF of 30 to 35%, moderate LVH, severe hypokinesis of the entire anteroseptal wall, indeterminate diastolic filling, mildly dilated LV internal cavity size, low normal RV systolic function with moderately enlarged ventricular cavity size, mild biatrial enlargement, trivial pericardial effusion anterior to the right ventricle, mild to moderate mitral regurgitation, mild to moderate tricuspid regurgitation, mild pulmonic regurgitation, moderately elevated PASP estimated at 47.6 mmHg, and an estimated right atrial pressure of 3 mmHg.  Given cardiomyopathy, she underwent Lexiscan MPI, at the request of her primary cardiologist, which showed no significant ischemia with an EF of 51% with no significant coronary calcification or aortic atherosclerosis noted on CT imaging.  Overall, this was a low risk scan.  Most recent echo from 02/2020 demonstrated an EF of 60 to 65%, no regional wall motion abnormalities, normal LV diastolic function parameters, normal RV systolic function and ventricular cavity size, and normal PASP.  She was last seen in the office on 02/01/2022 and was without symptoms of angina or decompensation.  She had been out of her medications for approximately 3 months and consumed a diet high in salt.  Blood pressure was significantly elevated in the office with an initial reading of 200/114 subsequently improved to 178/108  following 2 doses of clonidine 0.1 mg.  She was started on carvedilol and losartan with recommendation for close follow-up.  ***   Labs independently reviewed: 01/2022 - Hgb 14.2, PLT 238, TSH normal, potassium 3.7, BUN 11, serum creatinine 0.67, albumin 4.2, AST normal, ALT 47 07/2021 - A1c 5.4, TC 265, TG 293, HDL 44, LDL 171    Past Medical History:  Diagnosis Date   HFrEF (heart failure with reduced ejection fraction) (HCC)    Hyperlipidemia    Hypertensive heart disease    Nonischemic cardiomyopathy (HCC)     No past surgical history on file.  Current Medications: No outpatient medications have been marked as taking for the 03/07/22 encounter (Appointment) with Sondra Barges, PA-C.    Allergies:   Patient has no known allergies.   Social History   Socioeconomic History   Marital status: Widowed    Spouse name: Not on file   Number of children: Not on file   Years of education: Not on file   Highest education level: Not on file  Occupational History   Not on file  Tobacco Use   Smoking status: Never   Smokeless tobacco: Never  Vaping Use   Vaping Use: Never used  Substance and Sexual Activity   Alcohol use: Yes    Comment: occasional   Drug use: Never   Sexual activity: Not on file  Other Topics Concern   Not on file  Social History Narrative   Not on file   Social Determinants of Health   Financial Resource Strain: Not on file  Food Insecurity: Not on file  Transportation Needs: Not on file  Physical Activity: Not on file  Stress: Not on file  Social Connections: Not on file     Family History:  The patient's family history includes Breast cancer in her maternal grandmother and mother; Hyperlipidemia in her maternal grandmother and mother; Hypertension in her maternal grandmother and mother; Prostate cancer in her father.  ROS:   12-point review of systems is negative unless otherwise noted in the HPI.   EKGs/Labs/Other Studies Reviewed:     Studies reviewed were summarized above. The additional studies were reviewed today:  2D echo 03/24/2020: 1. Left ventricular ejection fraction, by estimation, is 60 to 65%. The  left ventricle has normal function. The left ventricle has no regional  wall motion abnormalities. Left ventricular diastolic parameters were  normal. The average left ventricular  global longitudinal strain is -15.9 %. The global longitudinal strain is  normal.   2. Right ventricular systolic function is normal. The right ventricular  size is normal. There is normal pulmonary artery systolic pressure. The  estimated right ventricular systolic pressure is 31.5 mmHg.  __________   Eugenie Birks MPI 07/09/2019: Pharmacological myocardial perfusion imaging study with no significant  ischemia Normal wall motion, EF estimated at 51% Equivocal EKG changes , less than 1 mm at peak stress CT coronary calcium scoring with no significant aortic atherosclerosis or coronary calcification Baseline hypertension noted, systolic pressures 160s Low risk scan __________   2D echo 04/14/2019: 1. Left ventricular ejection fraction, by visual estimation, is 30 to  35%. The left ventricle has severely decreased function. There is  moderately increased left ventricular hypertrophy.   2. Severe hypokinesis of the left ventricular, entire anteroseptal wall.   3. Indeterminate diastolic filling due to E-A fusion.   4. Mildly dilated left ventricular internal cavity size.   5. The left ventricle demonstrates global hypokinesis.   6. Global right ventricle has low normal systolic function.The right  ventricular size is moderately enlarged. No increase in right ventricular  wall thickness.   7. Left atrial size was mildly dilated.   8. Right atrial size was mildly dilated.   9. The pericardial effusion is anterior to the right ventricle.  10. Trivial pericardial effusion is present.  11. The mitral valve is normal in structure. Mild to  moderate mitral valve  regurgitation.  12. The tricuspid valve is normal in structure.  13. The tricuspid valve is normal in structure. Tricuspid valve  regurgitation is mild-moderate.  14. The aortic valve is tricuspid. Aortic valve regurgitation is not  visualized. No evidence of aortic valve sclerosis or stenosis.  15. Pulmonic regurgitation is mild.  16. The pulmonic valve was abnormal. Pulmonic valve regurgitation is mild.  17. Moderately elevated pulmonary artery systolic pressure.  18. The inferior vena cava is normal in size with greater than 50%  respiratory variability, suggesting right atrial pressure of 3 mmHg.  19. The interatrial septum was not well visualized.    EKG:  EKG is ordered today.  The EKG ordered today demonstrates ***  Recent Labs: 02/01/2022: ALT 47; BUN 11; Creatinine, Ser 0.67; Hemoglobin 14.2; Platelets 238; Potassium 3.7; Sodium 137; TSH 2.137  Recent Lipid Panel    Component Value Date/Time   CHOL 265 (H) 08/17/2021 1616   CHOL 190 05/10/2020 0840   TRIG 293 (H) 08/17/2021 1616   HDL 44 (L) 08/17/2021 1616   HDL 46 05/10/2020 0840   CHOLHDL 6.0 (H) 08/17/2021 1616   VLDL 62.2 (H) 10/06/2019 0911   LDLCALC 171 (H) 08/17/2021 1616   LDLDIRECT 189.0 10/06/2019 0911  PHYSICAL EXAM:    VS:  LMP 08/26/2018   BMI: There is no height or weight on file to calculate BMI.  Physical Exam  Wt Readings from Last 3 Encounters:  02/01/22 205 lb 8 oz (93.2 kg)  08/17/21 209 lb (94.8 kg)  11/08/20 195 lb (88.5 kg)     ASSESSMENT & PLAN:   HFrEF secondary to presumed NICM:  Hypertensive heart disease: Blood pressure  HLD: LDL 171 in 07/2021.  ***   {Are you ordering a CV Procedure (e.g. stress test, cath, DCCV, TEE, etc)?   Press F2        :[id number]}     Disposition: F/u with Dr. Mariah Milling or an APP in ***.   Medication Adjustments/Labs and Tests Ordered: Current medicines are reviewed at length with the patient today.  Concerns regarding  medicines are outlined above. Medication changes, Labs and Tests ordered today are summarized above and listed in the Patient Instructions accessible in Encounters.   Signed, Eula Listen, PA-C 02/27/2022 9:42 AM     [facility] - [place] [address] [place], [place] [address] [phone]

## 2022-03-07 ENCOUNTER — Encounter: Payer: Self-pay | Admitting: Physician Assistant

## 2022-03-07 ENCOUNTER — Ambulatory Visit: Payer: Managed Care, Other (non HMO) | Attending: Physician Assistant | Admitting: Physician Assistant

## 2022-03-07 VITALS — BP 181/103 | HR 90 | Ht 61.0 in | Wt 205.8 lb

## 2022-03-07 DIAGNOSIS — I5022 Chronic systolic (congestive) heart failure: Secondary | ICD-10-CM | POA: Diagnosis not present

## 2022-03-07 DIAGNOSIS — E782 Mixed hyperlipidemia: Secondary | ICD-10-CM | POA: Diagnosis not present

## 2022-03-07 DIAGNOSIS — I110 Hypertensive heart disease with heart failure: Secondary | ICD-10-CM | POA: Diagnosis not present

## 2022-03-07 DIAGNOSIS — I428 Other cardiomyopathies: Secondary | ICD-10-CM

## 2022-03-07 MED ORDER — CARVEDILOL 25 MG PO TABS: 25.0000 mg | ORAL_TABLET | Freq: Two times a day (BID) | ORAL | 0 refills | Status: DC

## 2022-03-07 MED ORDER — LOSARTAN POTASSIUM 100 MG PO TABS: 100.0000 mg | ORAL_TABLET | Freq: Every day | ORAL | 0 refills | Status: DC

## 2022-03-07 NOTE — Patient Instructions (Signed)
Medication Instructions:   INCREASE Carvedilol - take one tablet (25mg ) by mouth twice a day.  2. INCREASE Losartan - Take one tablet (100mg ) by mouth daily.   *If you need a refill on your cardiac medications before your next appointment, please call your pharmacy*    Follow-Up: At [facility], you and your health needs are our priority.  As part of our continuing mission to provide you with exceptional heart care, we have created designated Provider Care Teams.  These Care Teams include your primary Cardiologist (physician) and Advanced Practice Providers (APPs -  Physician Assistants and Nurse Practitioners) who all work together to provide you with the care you need, when you need it.  We recommend signing up for the patient portal called "MyChart".  Sign up information is provided on this After Visit Summary.  MyChart is used to connect with patients for Virtual Visits (Telemedicine).  Patients are able to view lab/test results, encounter notes, upcoming appointments, etc.  Non-urgent messages can be sent to your provider as well.   To learn more about what you can do with MyChart, go to .    Your next appointment:   1 month(s)  The format for your next appointment:   In Person  Provider:   You may see [facility], MD or one of the following Advanced Practice Providers on your designated Care Team:   [email or web address], NP Julien Nordmann, PA-C Cadence Nicolasa Ducking, PA-C Eula Listen, NP

## 2022-04-09 NOTE — Progress Notes (Unsigned)
Cardiology Office Note    Date:  04/10/2022   ID:  Rebecca Bennett, DOB 08-31-1965, MRN [id number]  PCP:  Lorre Munroe, NP  Cardiologist:  Julien Nordmann, MD  Electrophysiologist:  None   Chief Complaint: Follow-up  History of Present Illness:   Rebecca Bennett is a 57 y.o. 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   Echo in 03/2019 demonstrated an EF of 30 to 35%, moderate LVH, severe hypokinesis of the entire anteroseptal wall, indeterminate diastolic filling, mildly dilated LV internal cavity size, low normal RV systolic function with moderately enlarged ventricular cavity size, mild biatrial enlargement, trivial pericardial effusion anterior to the right ventricle, mild to moderate mitral regurgitation, mild to moderate tricuspid regurgitation, mild pulmonic regurgitation, moderately elevated PASP estimated at 47.6 mmHg, and an estimated right atrial pressure of 3 mmHg.  Given cardiomyopathy, she underwent Lexiscan MPI, at the request of her primary cardiologist, which showed no significant ischemia with an EF of 51% with no significant coronary calcification or aortic atherosclerosis noted on CT imaging.  Overall, this was a low risk scan.  Most recent echo from 02/2020 demonstrated an EF of 60 to 65%, no regional wall motion abnormalities, normal LV diastolic function parameters, normal RV systolic function and ventricular cavity size, and normal PASP.  She was seen in the office on 02/01/2022 and was without symptoms of angina or decompensation.  She had been out of her medications for approximately 3 months and consumed a diet high in salt.  Blood pressure was significantly elevated in the office with an initial reading of 200/114 subsequently improved to 178/108  following 2 doses of clonidine 0.1 mg.  She was started on carvedilol and losartan with recommendation for close follow-up.  She was last seen in the office in 02/2022 and remained without symptoms of angina or decompensation.  She was tolerating and adherent to medications.  Blood pressure remained elevated at 181/103.  She did continue to consume salt, though was trying to decrease this.  Carvedilol was titrated to 25 mg twice daily and losartan to 100 mg daily.  She comes in doing well from a cardiac perspective and is without symptoms of angina or decompensation.  No dizziness, presyncope, or syncope.  She is adherent and tolerating cardiac medications without issues.  She has not yet taken her medications today.  She is working on a heart healthy diet with a weight that is down 5 pounds by her scale.  She is minimizing her salt intake as well.   Labs independently reviewed: 01/2022 - Hgb 14.2, PLT 238, TSH normal, potassium 3.7, BUN 11, serum creatinine 0.67, albumin 4.2, AST normal, ALT 47 07/2021 - A1c 5.4, TC 265, TG 293, HDL 44, LDL 171  Past Medical History:  Diagnosis Date   HFrEF (heart failure with reduced ejection fraction) (HCC)    Hyperlipidemia    Hypertensive heart disease    Nonischemic cardiomyopathy (HCC)     History reviewed. No pertinent surgical history.  Current Medications: Current Meds  Medication Sig   acetaminophen (TYLENOL) 500 MG tablet Take 500 mg by mouth every 6 (six) hours as needed.   sacubitril-valsartan (ENTRESTO) 97-103 MG Take 1 tablet by mouth 2 (two) times daily.   [DISCONTINUED] carvedilol (COREG) 25 MG tablet Take 1 tablet (25 mg total) by  mouth 2 (two) times daily with a meal.   [DISCONTINUED] furosemide (LASIX) 20 MG tablet Take 1 tablet (20 mg total) by mouth daily.   [DISCONTINUED] losartan (COZAAR) 100 MG tablet Take 1 tablet (100 mg total) by mouth daily.   [DISCONTINUED] rosuvastatin (CRESTOR) 20 MG tablet Take 1 tablet (20 mg total) by  mouth daily.    Allergies:   Patient has no known allergies.   Social History   Socioeconomic History   Marital status: Widowed    Spouse name: Not on file   Number of children: Not on file   Years of education: Not on file   Highest education level: Not on file  Occupational History   Not on file  Tobacco Use   Smoking status: Never   Smokeless tobacco: Never  Vaping Use   Vaping Use: Never used  Substance and Sexual Activity   Alcohol use: Yes    Comment: occasional   Drug use: Never   Sexual activity: Not on file  Other Topics Concern   Not on file  Social History Narrative   Not on file   Social Determinants of Health   Financial Resource Strain: Not on file  Food Insecurity: Not on file  Transportation Needs: Not on file  Physical Activity: Not on file  Stress: Not on file  Social Connections: Not on file     Family History:  The patient's family history includes Breast cancer in her maternal grandmother and mother; Hyperlipidemia in her maternal grandmother and mother; Hypertension in her maternal grandmother and mother; Prostate cancer in her father.  ROS:   12-point review of systems is negative unless otherwise noted in the HPI   EKGs/Labs/Other Studies Reviewed:    Studies reviewed were summarized above. The additional studies were reviewed today:  2D echo 03/24/2020: 1. Left ventricular ejection fraction, by estimation, is 60 to 65%. The  left ventricle has normal function. The left ventricle has no regional  wall motion abnormalities. Left ventricular diastolic parameters were  normal. The average left ventricular  global longitudinal strain is -15.9 %. The global longitudinal strain is  normal.   2. Right ventricular systolic function is normal. The right ventricular  size is normal. There is normal pulmonary artery systolic pressure. The  estimated right ventricular systolic pressure is 30.1 mmHg.  __________   Carlton Adam MPI  07/09/2019: Pharmacological myocardial perfusion imaging study with no significant  ischemia Normal wall motion, EF estimated at 51% Equivocal EKG changes , less than 1 mm at peak stress CT coronary calcium scoring with no significant aortic atherosclerosis or coronary calcification Baseline hypertension noted, systolic pressures 601U Low risk scan __________   2D echo 04/14/2019: 1. Left ventricular ejection fraction, by visual estimation, is 30 to  35%. The left ventricle has severely decreased function. There is  moderately increased left ventricular hypertrophy.   2. Severe hypokinesis of the left ventricular, entire anteroseptal wall.   3. Indeterminate diastolic filling due to E-A fusion.   4. Mildly dilated left ventricular internal cavity size.   5. The left ventricle demonstrates global hypokinesis.   6. Global right ventricle has low normal systolic function.The right  ventricular size is moderately enlarged. No increase in right ventricular  wall thickness.   7. Left atrial size was mildly dilated.   8. Right atrial size was mildly dilated.   9. The pericardial effusion is anterior to the right ventricle.  10. Trivial pericardial effusion is present.  11. The mitral valve is normal  in structure. Mild to moderate mitral valve  regurgitation.  12. The tricuspid valve is normal in structure.  13. The tricuspid valve is normal in structure. Tricuspid valve  regurgitation is mild-moderate.  14. The aortic valve is tricuspid. Aortic valve regurgitation is not  visualized. No evidence of aortic valve sclerosis or stenosis.  15. Pulmonic regurgitation is mild.  16. The pulmonic valve was abnormal. Pulmonic valve regurgitation is mild.  17. Moderately elevated pulmonary artery systolic pressure.  18. The inferior vena cava is normal in size with greater than 50%  respiratory variability, suggesting right atrial pressure of 3 mmHg.  19. The interatrial septum was not well  visualized.   EKG:  EKG is ordered today.  The EKG ordered today demonstrates NSR, 83 bpm, no acute ST-T changes  Recent Labs: 02/01/2022: ALT 47; BUN 11; Creatinine, Ser 0.67; Hemoglobin 14.2; Platelets 238; Potassium 3.7; Sodium 137; TSH 2.137  Recent Lipid Panel    Component Value Date/Time   CHOL 265 (H) 08/17/2021 1616   CHOL 190 05/10/2020 0840   TRIG 293 (H) 08/17/2021 1616   HDL 44 (L) 08/17/2021 1616   HDL 46 05/10/2020 0840   CHOLHDL 6.0 (H) 08/17/2021 1616   VLDL 62.2 (H) 10/06/2019 0911   LDLCALC 171 (H) 08/17/2021 1616   LDLDIRECT 189.0 10/06/2019 0911    PHYSICAL EXAM:    VS:  BP (!) 160/100 (BP Location: Left Arm, Patient Position: Sitting, Cuff Size: Normal) Comment: AFTER EKG  Pulse 83   Ht 5\' 1"  (1.549 m)   Wt 202 lb 6 oz (91.8 kg)   LMP 08/26/2018   SpO2 97%   BMI 38.24 kg/m   BMI: Body mass index is 38.24 kg/m.  Physical Exam Vitals reviewed.  Constitutional:      Appearance: She is well-developed.  HENT:     Head: Normocephalic and atraumatic.  Eyes:     General:        Right eye: No discharge.        Left eye: No discharge.  Neck:     Vascular: No JVD.  Cardiovascular:     Rate and Rhythm: Normal rate and regular rhythm.     Pulses:          Posterior tibial pulses are 2+ on the right side and 2+ on the left side.     Heart sounds: Normal heart sounds, S1 normal and S2 normal. Heart sounds not distant. No midsystolic click and no opening snap. No murmur heard.    No friction rub.  Pulmonary:     Effort: Pulmonary effort is normal. No respiratory distress.     Breath sounds: Normal breath sounds. No decreased breath sounds, wheezing or rales.  Chest:     Chest wall: No tenderness.  Abdominal:     General: There is no distension.  Musculoskeletal:     Cervical back: Normal range of motion.     Right lower leg: No edema.     Left lower leg: No edema.  Skin:    General: Skin is warm and dry.     Nails: There is no clubbing.   Neurological:     Mental Status: She is alert and oriented to person, place, and time.  Psychiatric:        Speech: Speech normal.        Behavior: Behavior normal.        Thought Content: Thought content normal.        Judgment: Judgment normal.  Wt Readings from Last 3 Encounters:  04/10/22 202 lb 6 oz (91.8 kg)  03/07/22 205 lb 12.8 oz (93.4 kg)  02/01/22 205 lb 8 oz (93.2 kg)     ASSESSMENT & PLAN:   HFrEF secondary to presumed NICM: Euvolemic and well compensated.  Escalate GDMT with transition from losartan to Entresto 97/103 mg twice daily with continuation of carvedilol 25 mg twice daily and furosemide 20 mg daily.  Check electrolytes and renal function today.  In follow-up, if needed would look to add spironolactone for further optimization of GDMT and treatment of hypertensive heart disease.  Continued sodium restriction is recommended.  Hypertensive heart disease: Blood pressure remains elevated though is improving from prior readings.  She has not yet taken her morning medications.  Transition from losartan to [facility] as outlined above with continuation of carvedilol and furosemide.  HLD: LDL 171 in 07/2021.  Now back on rosuvastatin.  Check CMP and lipid panel.    Disposition: F/u with Dr. Mariah Milling or an APP in 1 month.   Medication Adjustments/Labs and Tests Ordered: Current medicines are reviewed at length with the patient today.  Concerns regarding medicines are outlined above. Medication changes, Labs and Tests ordered today are summarized above and listed in the Patient Instructions accessible in Encounters.   SignedEula Listen, PA-C 04/10/2022 8:30 AM     [facility] - [place] [address] [place], [place] [address] [phone]

## 2022-04-10 ENCOUNTER — Other Ambulatory Visit
Admission: RE | Admit: 2022-04-10 | Discharge: 2022-04-10 | Disposition: A | Discharge status: Home / Self Care | Payer: Managed Care, Other (non HMO) | Attending: Physician Assistant | Admitting: Physician Assistant

## 2022-04-10 ENCOUNTER — Ambulatory Visit: Payer: Managed Care, Other (non HMO) | Attending: Physician Assistant | Admitting: Physician Assistant

## 2022-04-10 ENCOUNTER — Encounter: Payer: Self-pay | Admitting: Physician Assistant

## 2022-04-10 VITALS — BP 160/100 | HR 83 | Ht 61.0 in | Wt 202.4 lb

## 2022-04-10 DIAGNOSIS — E782 Mixed hyperlipidemia: Secondary | ICD-10-CM | POA: Diagnosis not present

## 2022-04-10 DIAGNOSIS — I5022 Chronic systolic (congestive) heart failure: Secondary | ICD-10-CM | POA: Diagnosis not present

## 2022-04-10 DIAGNOSIS — I428 Other cardiomyopathies: Secondary | ICD-10-CM

## 2022-04-10 DIAGNOSIS — I110 Hypertensive heart disease with heart failure: Secondary | ICD-10-CM | POA: Insufficient documentation

## 2022-04-10 LAB — COMPREHENSIVE METABOLIC PANEL
ALT: 29 U/L (ref 0–44)
AST: 22 U/L (ref 15–41)
Albumin: 4.2 g/dL (ref 3.5–5.0)
Alkaline Phosphatase: 67 U/L (ref 38–126)
Anion gap: 8 (ref 5–15)
BUN: 14 mg/dL (ref 6–20)
CO2: 27 mmol/L (ref 22–32)
Calcium: 9.4 mg/dL (ref 8.9–10.3)
Chloride: 102 mmol/L (ref 98–111)
Creatinine, Ser: 0.8 mg/dL (ref 0.44–1.00)
GFR, Estimated: >60 mL/min (ref >60)
Glucose, Bld: 110 mg/dL — ABNORMAL HIGH (ref 70–99)
Potassium: 4.2 mmol/L (ref 3.5–5.1)
Sodium: 137 mmol/L (ref 135–145)
Total Bilirubin: 0.9 mg/dL (ref 0.3–1.2)
Total Protein: 7.7 g/dL (ref 6.5–8.1)

## 2022-04-10 LAB — LIPID PANEL
Cholesterol: 155 mg/dL (ref 0–200)
HDL: 34 mg/dL — ABNORMAL LOW (ref >40)
LDL Cholesterol: 81 mg/dL (ref 0–99)
Total CHOL/HDL Ratio: 4.6 RATIO
Triglycerides: 199 mg/dL — ABNORMAL HIGH (ref <150)
VLDL: 40 mg/dL (ref 0–40)

## 2022-04-10 MED ORDER — ENTRESTO 97-103 MG PO TABS: 1.0000 | ORAL_TABLET | Freq: Two times a day (BID) | ORAL | 11 refills | Status: DC

## 2022-04-10 MED ORDER — ROSUVASTATIN CALCIUM 20 MG PO TABS: 20.0000 mg | ORAL_TABLET | Freq: Every day | ORAL | 3 refills | Status: AC

## 2022-04-10 MED ORDER — CARVEDILOL 25 MG PO TABS: 25.0000 mg | ORAL_TABLET | Freq: Two times a day (BID) | ORAL | 3 refills | Status: DC

## 2022-04-10 MED ORDER — FUROSEMIDE 20 MG PO TABS: 20.0000 mg | ORAL_TABLET | Freq: Every day | ORAL | 3 refills | Status: DC

## 2022-04-10 NOTE — Patient Instructions (Addendum)
Medication Instructions:  Your physician has recommended you make the following change in your medication:   STOP Losartan START Entresto 97-103 mg twice a day  *If you need a refill on your cardiac medications before your next appointment, please call your pharmacy*   Lab Work: CMET & Lipid panel today over at the [facility] entrance and stop at registration desk.   If you have labs (blood work) drawn today and your tests are completely normal, you will receive your results only by: [place] (if you have MyChart) OR A paper copy in the mail If you have any lab test that is abnormal or we need to change your treatment, we will call you to review the results.   Testing/Procedures: None   Follow-Up: At [facility], you and your health needs are our priority.  As part of our continuing mission to provide you with exceptional heart care, we have created designated Provider Care Teams.  These Care Teams include your primary Cardiologist (physician) and Advanced Practice Providers (APPs -  Physician Assistants and Nurse Practitioners) who all work together to provide you with the care you need, when you need it.  Your next appointment:   1 month(s)  Provider:   Ida Rogue, MD or Christell Faith, PA-C

## 2022-05-10 NOTE — Progress Notes (Unsigned)
Cardiology Office Note    Date:  05/10/2022   ID:  Rebecca Bennett, DOB [birth year], MRN [id number]  PCP:  [name], NP  Cardiologist:  Ida Rogue, MD  Electrophysiologist:  None   Chief Complaint: Follow up  History of Present Illness:   Rebecca Bennett is a 57 y.o. 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   Echo in 03/2019 demonstrated an EF of 30 to 35%, moderate LVH, severe hypokinesis of the entire anteroseptal wall, indeterminate diastolic filling, mildly dilated LV internal cavity size, low normal RV systolic function with moderately enlarged ventricular cavity size, mild biatrial enlargement, trivial pericardial effusion anterior to the right ventricle, mild to moderate mitral regurgitation, mild to moderate tricuspid regurgitation, mild pulmonic regurgitation, moderately elevated PASP estimated at 47.6 mmHg, and an estimated right atrial pressure of 3 mmHg.  Given cardiomyopathy, she underwent Lexiscan MPI, at the request of her primary cardiologist, which showed no significant ischemia with an EF of 51% with no significant coronary calcification or aortic atherosclerosis noted on CT imaging.  Overall, this was a low risk scan.  Most recent echo from 02/2020 demonstrated an EF of 60 to 65%, no regional wall motion abnormalities, normal LV diastolic function parameters, normal RV systolic function and ventricular cavity size, and normal PASP.  She was seen in the office on 02/01/2022 and was without symptoms of angina or decompensation.  She had been out of her medications for approximately 3 months and consumed a diet high in salt.  Blood pressure was significantly elevated in the office with an initial reading of 200/114 subsequently improved to 178/108  following 2 doses of clonidine 0.1 mg.  She was started on carvedilol and losartan with recommendation for close follow-up.  She was seen in the office in 02/2022 and remained without symptoms of angina or decompensation.  She was tolerating and adherent to medications.  Blood pressure remained elevated at 181/103.  She did continue to consume salt, though was trying to decrease this.  Carvedilol was titrated to 25 mg twice daily and losartan to 100 mg daily.  She was last seen in the office in 03/2022 and remained without symptoms of angina or cardiac decompensation.  She was adherent and tolerating cardiac medications.  Blood pressure was elevated in the office at 160/100, though she had not yet taken her medications that morning.  She was working on a heart healthy diet with her weight noted to be down 5 pounds by her scale.  She was working on minimizing salt intake as well.  She was transitioned from losartan to [facility] 97/23 mg twice daily with continuation of carvedilol 25 mg twice daily and furosemide 20 mg daily.  ***   Labs independently reviewed: 03/2022 - potassium 4.2, BUN 14, serum creatinine 0.8, albumin 4.2, AST/ALT normal, TC 155, TG 199, HDL 34, LDL 81 01/2022 - Hgb 14.2, PLT 238, TSH normal 07/2021 - A1c 5.4  Past Medical History:  Diagnosis Date   HFrEF (heart failure with reduced ejection fraction) ([place])    Hyperlipidemia    Hypertensive heart disease    Nonischemic cardiomyopathy ([place])     No past surgical history on file.  Current Medications: No outpatient medications have been marked as taking for the 05/14/22 encounter (Appointment) with Rise Mu, PA-C.  Allergies:   Patient has no known allergies.   Social History   Socioeconomic History   Marital status: Widowed    Spouse name: Not on file   Number of children: Not on file   Years of education: Not on file   Highest education level: Not on file  Occupational History   Not on file  Tobacco Use    Smoking status: Never   Smokeless tobacco: Never  Vaping Use   Vaping Use: Never used  Substance and Sexual Activity   Alcohol use: Yes    Comment: occasional   Drug use: Never   Sexual activity: Not on file  Other Topics Concern   Not on file  Social History Narrative   Not on file   Social Determinants of Health   Financial Resource Strain: Not on file  Food Insecurity: Not on file  Transportation Needs: Not on file  Physical Activity: Not on file  Stress: Not on file  Social Connections: Not on file     Family History:  The patient's family history includes Breast cancer in her maternal grandmother and mother; Hyperlipidemia in her maternal grandmother and mother; Hypertension in her maternal grandmother and mother; Prostate cancer in her father.  ROS:   12-point review of systems is negative unless otherwise noted in the HPI.   EKGs/Labs/Other Studies Reviewed:    Studies reviewed were summarized above. The additional studies were reviewed today:  2D echo 03/24/2020: 1. Left ventricular ejection fraction, by estimation, is 60 to 65%. The  left ventricle has normal function. The left ventricle has no regional  wall motion abnormalities. Left ventricular diastolic parameters were  normal. The average left ventricular  global longitudinal strain is -15.9 %. The global longitudinal strain is  normal.   2. Right ventricular systolic function is normal. The right ventricular  size is normal. There is normal pulmonary artery systolic pressure. The  estimated right ventricular systolic pressure is 0000000 mmHg.  __________   Carlton Adam MPI 07/09/2019: Pharmacological myocardial perfusion imaging study with no significant  ischemia Normal wall motion, EF estimated at 51% Equivocal EKG changes , less than 1 mm at peak stress CT coronary calcium scoring with no significant aortic atherosclerosis or coronary calcification Baseline hypertension noted, systolic pressures [id number] Low  risk scan __________   2D echo 04/14/2019: 1. Left ventricular ejection fraction, by visual estimation, is 30 to  35%. The left ventricle has severely decreased function. There is  moderately increased left ventricular hypertrophy.   2. Severe hypokinesis of the left ventricular, entire anteroseptal wall.   3. Indeterminate diastolic filling due to E-A fusion.   4. Mildly dilated left ventricular internal cavity size.   5. The left ventricle demonstrates global hypokinesis.   6. Global right ventricle has low normal systolic function.The right  ventricular size is moderately enlarged. No increase in right ventricular  wall thickness.   7. Left atrial size was mildly dilated.   8. Right atrial size was mildly dilated.   9. The pericardial effusion is anterior to the right ventricle.  10. Trivial pericardial effusion is present.  11. The mitral valve is normal in structure. Mild to moderate mitral valve  regurgitation.  12. The tricuspid valve is normal in structure.  13. The tricuspid valve is normal in structure. Tricuspid valve  regurgitation is mild-moderate.  14. The aortic valve is tricuspid. Aortic valve regurgitation is not  visualized. No evidence of aortic valve sclerosis or stenosis.  15. Pulmonic regurgitation is mild.  16. The pulmonic valve was abnormal. Pulmonic valve regurgitation is mild.  17. Moderately elevated pulmonary artery systolic pressure.  18. The inferior vena cava is normal in size with greater than 50%  respiratory variability, suggesting right atrial pressure of 3 mmHg.  19. The interatrial septum was not well visualized.   EKG:  EKG is ordered today.  The EKG ordered today demonstrates ***  Recent Labs: 02/01/2022: Hemoglobin 14.2; Platelets 238; TSH 2.137 04/10/2022: ALT 29; BUN 14; Creatinine, Ser 0.80; Potassium 4.2; Sodium 137  Recent Lipid Panel    Component Value Date/Time   CHOL 155 04/10/2022 0834   CHOL 190 05/10/2020 0840   TRIG 199 (H)  04/10/2022 0834   HDL 34 (L) 04/10/2022 0834   HDL 46 05/10/2020 0840   CHOLHDL 4.6 04/10/2022 0834   VLDL 40 04/10/2022 0834   LDLCALC 81 04/10/2022 0834   LDLCALC 171 (H) 08/17/2021 1616   LDLDIRECT 189.0 10/06/2019 0911    PHYSICAL EXAM:    VS:  LMP 08/26/2018   BMI: There is no height or weight on file to calculate BMI.  Physical Exam  Wt Readings from Last 3 Encounters:  04/10/22 202 lb 6 oz (91.8 kg)  03/07/22 205 lb 12.8 oz (93.4 kg)  02/01/22 205 lb 8 oz (93.2 kg)     ASSESSMENT & PLAN:   HFrEF secondary to presumed NICM:  Hypertensive heart disease: Blood pressure ***   HLD: LDL 81 in 03/2022 back on ***.   {Are you ordering a CV Procedure (e.g. stress test, cath, DCCV, TEE, etc)?   Press F2        :[id number]     Disposition: F/u with Dr. Rockey Situ or an APP in ***.   Medication Adjustments/Labs and Tests Ordered: Current medicines are reviewed at length with the patient today.  Concerns regarding medicines are outlined above. Medication changes, Labs and Tests ordered today are summarized above and listed in the Patient Instructions accessible in Encounters.   Signed, Christell Faith, PA-C 05/10/2022 1:23 PM     [facility] [address] [place], [place] [address] [phone]

## 2022-05-14 ENCOUNTER — Ambulatory Visit: Payer: Managed Care, Other (non HMO) | Attending: Physician Assistant | Admitting: Physician Assistant

## 2022-05-14 ENCOUNTER — Encounter: Payer: Self-pay | Admitting: Physician Assistant

## 2022-05-14 VITALS — BP 180/110 | HR 76 | Ht 61.0 in | Wt 194.0 lb

## 2022-05-14 DIAGNOSIS — I110 Hypertensive heart disease with heart failure: Secondary | ICD-10-CM | POA: Diagnosis not present

## 2022-05-14 DIAGNOSIS — I5022 Chronic systolic (congestive) heart failure: Secondary | ICD-10-CM | POA: Diagnosis not present

## 2022-05-14 DIAGNOSIS — E782 Mixed hyperlipidemia: Secondary | ICD-10-CM | POA: Diagnosis not present

## 2022-05-14 DIAGNOSIS — I428 Other cardiomyopathies: Secondary | ICD-10-CM

## 2022-05-14 NOTE — Patient Instructions (Signed)
Medication Instructions:  Your physician recommends that you continue on your current medications as directed. Please refer to the Current Medication list given to you today.  *If you need a refill on your cardiac medications before your next appointment, please call your pharmacy*   Lab Work: - None ordered If you have labs (blood work) drawn today and your tests are completely normal, you will receive your results only by: [place] (if you have MyChart) OR A paper copy in the mail If you have any lab test that is abnormal or we need to change your treatment, we will call you to review the results.   Testing/Procedures: - None ordered   Follow-Up: At [facility], you and your health needs are our priority.  As part of our continuing mission to provide you with exceptional heart care, we have created designated Provider Care Teams.  These Care Teams include your primary Cardiologist (physician) and Advanced Practice Providers (APPs -  Physician Assistants and Nurse Practitioners) who all work together to provide you with the care you need, when you need it.  We recommend signing up for the patient portal called "MyChart".  Sign up information is provided on this After Visit Summary.  MyChart is used to connect with patients for Virtual Visits (Telemedicine).  Patients are able to view lab/test results, encounter notes, upcoming appointments, etc.  Non-urgent messages can be sent to your provider as well.   To learn more about what you can do with MyChart, go to [email or web address].    Your next appointment:   2 month(s)  Provider:   You will see one of the following Advanced Practice Providers on your designated Care Team:   Christell Faith, PA-C      Other Instructions Call the office with weekly log of BP readings

## 2022-07-16 ENCOUNTER — Ambulatory Visit: Payer: Managed Care, Other (non HMO) | Admitting: Physician Assistant

## 2022-08-02 NOTE — Progress Notes (Signed)
Cardiology Office Note    Date:  08/06/2022   ID:  Rebecca Bennett, DOB 1966-01-23, MRN [id number]  PCP:  Lorre Munroe, NP  Cardiologist:  Julien Nordmann, MD  Electrophysiologist:  None   Chief Complaint: Follow up  History of Present Illness:   Rebecca Bennett is a 57 y.o. 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   Echo in 03/2019 demonstrated an EF of 30 to 35%, moderate LVH, severe hypokinesis of the entire anteroseptal wall, indeterminate diastolic filling, mildly dilated LV internal cavity size, low normal RV systolic function with moderately enlarged ventricular cavity size, mild biatrial enlargement, trivial pericardial effusion anterior to the right ventricle, mild to moderate mitral regurgitation, mild to moderate tricuspid regurgitation, mild pulmonic regurgitation, moderately elevated PASP estimated at 47.6 mmHg, and an estimated right atrial pressure of 3 mmHg.  Given cardiomyopathy, she underwent Lexiscan MPI, at the request of her primary cardiologist, which showed no significant ischemia with an EF of 51% with no significant coronary calcification or aortic atherosclerosis noted on CT imaging.  Overall, this was a low risk scan.  Most recent echo from 02/2020 demonstrated an EF of 60 to 65%, no regional wall motion abnormalities, normal LV diastolic function parameters, normal RV systolic function and ventricular cavity size, and normal PASP.  She was seen in the office on 02/01/2022 and was without symptoms of angina or decompensation.  She had been out of her medications for approximately 3 months and consumed a diet high in salt.  Blood pressure was significantly elevated in the office with an initial reading of 200/114 subsequently improved to 178/108  following 2 doses of clonidine 0.1 mg.  She was started on carvedilol and losartan with recommendation for close follow-up.  She was seen in the office in 02/2022 and remained without symptoms of angina or decompensation.  She was tolerating and adherent to medications.  Blood pressure remained elevated at 181/103.  She did continue to consume salt, though was trying to decrease this.  Carvedilol was titrated to 25 mg twice daily and losartan to 100 mg daily.  She was seen in the office in 03/2022 with blood pressure was elevated in the office at 160/100, though she had not yet taken her medications that morning.  She was working on a heart healthy diet with her weight noted to be down 5 pounds by her scale.  She was working on minimizing salt intake as well.  She was transitioned from losartan to [facility] 97/23 mg twice daily with continuation of carvedilol 25 mg twice daily and furosemide 20 mg daily.  She was last seen in 04/2022 and remained without symptoms of angina or cardiac decompensation.  She had undergone significant lifestyle modification.  Her weight was down 8 pounds when compared to her visit a month prior and a total of 13 pounds by her home scale.  Blood pressure was 180/110, though she had not yet taken her medications.  Given this escalation of GDMT was deferred.  She comes in doing well from a cardiac perspective and is without symptoms of angina or cardiac decompensation.  No dyspnea, dizziness, presyncope, or syncope.  She did have 1 episodes of tachypalpitations on [facility] Day, none since.  Her weight is down another 7 pounds today when compared to her visit in  04/2022.  No lower extremity swelling or progressive orthopnea.  She is adherent and tolerating her antihypertensives.  Blood pressures at home are typically in the 160s over 80s.  Overall, she is pleased with her progress.   Labs independently reviewed: 03/2022 - potassium 4.2, BUN 14, serum creatinine 0.8, albumin 4.2,  AST/ALT normal, TC 155, TG 199, HDL 34, LDL 81 01/2022 - Hgb 14.2, PLT 238, TSH normal 07/2021 - A1c 5.4  Past Medical History:  Diagnosis Date   HFrEF (heart failure with reduced ejection fraction) ([facility])    Hyperlipidemia    Hypertensive heart disease    Nonischemic cardiomyopathy ([facility])     History reviewed. No pertinent surgical history.  Current Medications: Current Meds  Medication Sig   acetaminophen (TYLENOL) 500 MG tablet Take 500 mg by mouth every 6 (six) hours as needed.   amLODipine (NORVASC) 5 MG tablet Take 1 tablet (5 mg total) by mouth daily.   carvedilol (COREG) 25 MG tablet Take 1 tablet (25 mg total) by mouth 2 (two) times daily with a meal.   furosemide (LASIX) 20 MG tablet Take 1 tablet (20 mg total) by mouth daily.   rosuvastatin (CRESTOR) 20 MG tablet Take 1 tablet (20 mg total) by mouth daily.   sacubitril-valsartan (ENTRESTO) 97-103 MG Take 1 tablet by mouth 2 (two) times daily.    Allergies:   Patient has no known allergies.   Social History   Socioeconomic History   Marital status: Widowed    Spouse name: Not on file   Number of children: Not on file   Years of education: Not on file   Highest education level: Not on file  Occupational History   Not on file  Tobacco Use   Smoking status: Never   Smokeless tobacco: Never  Vaping Use   Vaping Use: Never used  Substance and Sexual Activity   Alcohol use: Yes    Comment: occasional   Drug use: Never   Sexual activity: Not on file  Other Topics Concern   Not on file  Social History Narrative   Not on file   Social Determinants of Health   Financial Resource Strain: Not on file  Food Insecurity: Not on file  Transportation Needs: Not on file  Physical Activity: Not on file  Stress: Not on file  Social Connections: Not on file     Family History:  The patient's family history includes Breast cancer in her maternal grandmother and mother; Hyperlipidemia in her maternal grandmother and  mother; Hypertension in her maternal grandmother and mother; Prostate cancer in her father.  ROS:   12-point review of systems is negative unless otherwise noted in the HPI.   EKGs/Labs/Other Studies Reviewed:    Studies reviewed were summarized above. The additional studies were reviewed today:  2D echo 03/24/2020: 1. Left ventricular ejection fraction, by estimation, is 60 to 65%. The  left ventricle has normal function. The left ventricle has no regional  wall motion abnormalities. Left ventricular diastolic parameters were  normal. The average left ventricular  global longitudinal strain is -15.9 %. The global longitudinal strain is  normal.   2. Right ventricular systolic function is normal. The right ventricular  size is normal. There is normal pulmonary artery systolic pressure. The  estimated right ventricular systolic pressure is 31.5 mmHg.  __________   Eugenie Birks MPI 07/09/2019: Pharmacological myocardial perfusion imaging study with no significant  ischemia Normal wall motion, EF estimated at 51% Equivocal EKG changes , less than 1  mm at peak stress CT coronary calcium scoring with no significant aortic atherosclerosis or coronary calcification Baseline hypertension noted, systolic pressures 160s Low risk scan __________   2D echo 04/14/2019: 1. Left ventricular ejection fraction, by visual estimation, is 30 to  35%. The left ventricle has severely decreased function. There is  moderately increased left ventricular hypertrophy.   2. Severe hypokinesis of the left ventricular, entire anteroseptal wall.   3. Indeterminate diastolic filling due to E-A fusion.   4. Mildly dilated left ventricular internal cavity size.   5. The left ventricle demonstrates global hypokinesis.   6. Global right ventricle has low normal systolic function.The right  ventricular size is moderately enlarged. No increase in right ventricular  wall thickness.   7. Left atrial size was mildly  dilated.   8. Right atrial size was mildly dilated.   9. The pericardial effusion is anterior to the right ventricle.  10. Trivial pericardial effusion is present.  11. The mitral valve is normal in structure. Mild to moderate mitral valve  regurgitation.  12. The tricuspid valve is normal in structure.  13. The tricuspid valve is normal in structure. Tricuspid valve  regurgitation is mild-moderate.  14. The aortic valve is tricuspid. Aortic valve regurgitation is not  visualized. No evidence of aortic valve sclerosis or stenosis.  15. Pulmonic regurgitation is mild.  16. The pulmonic valve was abnormal. Pulmonic valve regurgitation is mild.  17. Moderately elevated pulmonary artery systolic pressure.  18. The inferior vena cava is normal in size with greater than 50%  respiratory variability, suggesting right atrial pressure of 3 mmHg.  19. The interatrial septum was not well visualized.   EKG:  EKG is not ordered today.    Recent Labs: 02/01/2022: Hemoglobin 14.2; Platelets 238; TSH 2.137 04/10/2022: ALT 29; BUN 14; Creatinine, Ser 0.80; Potassium 4.2; Sodium 137  Recent Lipid Panel    Component Value Date/Time   CHOL 155 04/10/2022 0834   CHOL 190 05/10/2020 0840   TRIG 199 (H) 04/10/2022 0834   HDL 34 (L) 04/10/2022 0834   HDL 46 05/10/2020 0840   CHOLHDL 4.6 04/10/2022 0834   VLDL 40 04/10/2022 0834   LDLCALC 81 04/10/2022 0834   LDLCALC 171 (H) 08/17/2021 1616   LDLDIRECT 189.0 10/06/2019 0911    PHYSICAL EXAM:    VS:  BP (!) 148/98 (BP Location: Left Arm, Cuff Size: Large)   Pulse 82   Ht 5\' 1"  (1.549 m)   Wt 187 lb 9.6 oz (85.1 kg)   LMP 08/26/2018   SpO2 100%   BMI 35.45 kg/m   BMI: Body mass index is 35.45 kg/m.  Physical Exam Vitals reviewed.  Constitutional:      Appearance: She is well-developed.  HENT:     Head: Normocephalic and atraumatic.  Eyes:     General:        Right eye: No discharge.        Left eye: No discharge.  Neck:     Vascular:  No JVD.  Cardiovascular:     Rate and Rhythm: Normal rate and regular rhythm.     Heart sounds: Normal heart sounds, S1 normal and S2 normal. Heart sounds not distant. No midsystolic click and no opening snap. No murmur heard.    No friction rub.  Pulmonary:     Effort: Pulmonary effort is normal. No respiratory distress.     Breath sounds: Normal breath sounds. No decreased breath sounds, wheezing or rales.  Chest:  Chest wall: No tenderness.  Abdominal:     General: There is no distension.  Musculoskeletal:     Cervical back: Normal range of motion.  Skin:    General: Skin is warm and dry.     Nails: There is no clubbing.  Neurological:     Mental Status: She is alert and oriented to person, place, and time.  Psychiatric:        Speech: Speech normal.        Behavior: Behavior normal.        Thought Content: Thought content normal.        Judgment: Judgment normal.     Wt Readings from Last 3 Encounters:  08/06/22 187 lb 9.6 oz (85.1 kg)  05/14/22 194 lb (88 kg)  04/10/22 202 lb 6 oz (91.8 kg)     ASSESSMENT & PLAN:   HFrEF secondary to presumed NICM: Euvolemic and well compensated.  Most recent echo showed normalization of LV systolic function.  She remains on carvedilol 25 mg twice daily, Entresto 97/103 mg twice daily, and furosemide 20 mg daily.  Recent renal function and electrolytes stable.  Continue to restrict sodium.  Hypertensive heart disease: Blood pressure at triage improved at 148/98.  Add amlodipine 5 mg daily with recommendation to increase to 10 mg daily in 3 to 4 weeks if systolic blood pressures remain consistently greater than 140 mmHg.  Otherwise, continue current dose of carvedilol and Entresto.  Her weight is down another 7 pounds today when compared to revisit in 04/2022.  This is intentional through lifestyle modification.  HLD: LDL 81 in 03/2022, back on rosuvastatin which will be continued.   Disposition: F/u with Dr. Mariah Milling or an APP in 2  months.   Medication Adjustments/Labs and Tests Ordered: Current medicines are reviewed at length with the patient today.  Concerns regarding medicines are outlined above. Medication changes, Labs and Tests ordered today are summarized above and listed in the Patient Instructions accessible in Encounters.   Signed, Eula Listen, PA-C 08/06/2022 8:48 AM     [facility] [place] [address] [place], [place] [address] [phone]

## 2022-08-06 ENCOUNTER — Encounter: Payer: Self-pay | Admitting: Physician Assistant

## 2022-08-06 ENCOUNTER — Ambulatory Visit: Payer: Managed Care, Other (non HMO) | Attending: Physician Assistant | Admitting: Physician Assistant

## 2022-08-06 VITALS — BP 148/98 | HR 82 | Ht 61.0 in | Wt 187.6 lb

## 2022-08-06 DIAGNOSIS — I428 Other cardiomyopathies: Secondary | ICD-10-CM

## 2022-08-06 DIAGNOSIS — E782 Mixed hyperlipidemia: Secondary | ICD-10-CM | POA: Diagnosis not present

## 2022-08-06 DIAGNOSIS — I110 Hypertensive heart disease with heart failure: Secondary | ICD-10-CM | POA: Diagnosis not present

## 2022-08-06 DIAGNOSIS — I5022 Chronic systolic (congestive) heart failure: Secondary | ICD-10-CM

## 2022-08-06 MED ORDER — AMLODIPINE BESYLATE 5 MG PO TABS: 5.0000 mg | ORAL_TABLET | Freq: Every day | ORAL | 3 refills | Status: DC

## 2022-08-06 NOTE — Patient Instructions (Signed)
Medication Instructions:  Your physician has recommended you make the following change in your medication:   START - amLODipine (NORVASC) 5 MG tablet - Take 1 tablet (5 mg total) by mouth daily.  **Can increase to 10 MG in 3 weeks for systolic blood pressure (top number) greater than 140**  *If you need a refill on your cardiac medications before your next appointment, please call your pharmacy*   Lab Work: -None ordered  If you have labs (blood work) drawn today and your tests are completely normal, you will receive your results only by: MyChart Message (if you have MyChart) OR A paper copy in the mail If you have any lab test that is abnormal or we need to change your treatment, we will call you to review the results.   Testing/Procedures: -None ordered   Follow-Up: At [facility], you and your health needs are our priority.  As part of our continuing mission to provide you with exceptional heart care, we have created designated Provider Care Teams.  These Care Teams include your primary Cardiologist (physician) and Advanced Practice Providers (APPs -  Physician Assistants and Nurse Practitioners) who all work together to provide you with the care you need, when you need it.  We recommend signing up for the patient portal called "MyChart".  Sign up information is provided on this After Visit Summary.  MyChart is used to connect with patients for Virtual Visits (Telemedicine).  Patients are able to view lab/test results, encounter notes, upcoming appointments, etc.  Non-urgent messages can be sent to your provider as well.   To learn more about what you can do with MyChart, go to [email or web address].    Your next appointment:   2 month(s)  Provider:   Eula Listen, PA-C    Other Instructions -None

## 2022-10-15 ENCOUNTER — Ambulatory Visit: Payer: Managed Care, Other (non HMO) | Attending: Physician Assistant | Admitting: Physician Assistant

## 2022-10-15 ENCOUNTER — Encounter: Payer: Self-pay | Admitting: Physician Assistant

## 2022-10-15 VITALS — BP 130/78 | HR 75 | Ht 61.0 in | Wt 190.0 lb

## 2022-10-15 DIAGNOSIS — I110 Hypertensive heart disease with heart failure: Secondary | ICD-10-CM

## 2022-10-15 DIAGNOSIS — E782 Mixed hyperlipidemia: Secondary | ICD-10-CM

## 2022-10-15 DIAGNOSIS — I428 Other cardiomyopathies: Secondary | ICD-10-CM

## 2022-10-15 DIAGNOSIS — I5022 Chronic systolic (congestive) heart failure: Secondary | ICD-10-CM

## 2022-10-15 NOTE — Progress Notes (Signed)
Cardiology Office Note    Date:  10/15/2022   ID:  [name], DOB [birth year], MRN [id number]  PCP:  Lorre Munroe, NP  Cardiologist:  Julien Nordmann, MD  Electrophysiologist:  None   Chief Complaint: Follow-up  History of Present Illness:   Rebecca Bennett is a 57 y.o. 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   Echo in 03/2019 demonstrated an EF of 30 to 35%, moderate LVH, severe hypokinesis of the entire anteroseptal wall, indeterminate diastolic filling, mildly dilated LV internal cavity size, low normal RV systolic function with moderately enlarged ventricular cavity size, mild biatrial enlargement, trivial pericardial effusion anterior to the right ventricle, mild to moderate mitral regurgitation, mild to moderate tricuspid regurgitation, mild pulmonic regurgitation, moderately elevated PASP estimated at 47.6 mmHg, and an estimated right atrial pressure of 3 mmHg.  Given cardiomyopathy, she underwent Lexiscan MPI, at the request of her primary cardiologist, which showed no significant ischemia with an EF of 51% with no significant coronary calcification or aortic atherosclerosis noted on CT imaging.  Overall, this was a low risk scan.  Most recent echo from 02/2020 demonstrated an EF of 60 to 65%, no regional wall motion abnormalities, normal LV diastolic function parameters, normal RV systolic function and ventricular cavity size, and normal PASP.  She was seen in the office on 02/01/2022 and was without symptoms of angina or decompensation.  She had been out of her medications for approximately 3 months and consumed a diet high in salt.  Blood pressure was significantly elevated in the office with an initial reading of 200/114 subsequently improved to 178/108  following 2 doses of clonidine 0.1 mg.  She was started on carvedilol and losartan with recommendation for close follow-up.  She was seen in the office in 02/2022 and remained without symptoms of angina or decompensation.  She was tolerating and adherent to medications.  Blood pressure remained elevated at 181/103.  She did continue to consume salt, though was trying to decrease this.  Carvedilol was titrated to 25 mg twice daily and losartan to 100 mg daily.  She was seen in the office in 03/2022 with blood pressure was elevated in the office at 160/100, though she had not yet taken her medications that morning.  She was working on a heart healthy diet with her weight noted to be down 5 pounds by her scale.  She was working on minimizing salt intake as well.  She was transitioned from losartan to [facility] 97/23 mg twice daily with continuation of carvedilol 25 mg twice daily and furosemide 20 mg daily.  She was seen in 04/2022 and had undergone significant lifestyle modification.  Her weight was down 8 pounds when compared to her visit a month prior and a total of 13 pounds by her home scale.  Blood pressure was 180/110, though she had not yet taken her medications.  Given this escalation of GDMT was deferred.  She was last seen in the office in 07/2022 with blood pressure at home typically in the 160s over 80s.  Her weight was down another 7 pounds when compared to her visit in 04/2022.  She was adherent and tolerating medical therapy.  Blood pressure 148/98 in the office.  Amlodipine 5 mg daily was added with continuation of carvedilol and Entresto.  She comes  in doing well from a cardiac perspective and is without symptoms of angina or cardiac decompensation.  No dyspnea, dizziness, presyncope, or syncope.  All blood pressure readings have been in the 130s systolic.  She has tolerated the addition of amlodipine without issues outside of difficulty swallowing this pill.  Her weight is up 3 pounds today when  compared to last clinic visit, though has had some slight dietary indiscretion with celebration.  Overall, she is pleased with her progress and does not have any acute cardiac issues at this time.   Labs independently reviewed: 03/2022 - potassium 4.2, BUN 14, serum creatinine 0.8, albumin 4.2, AST/ALT normal, TC 155, TG 199, HDL 34, LDL 81 01/2022 - Hgb 14.2, PLT 238, TSH normal 07/2021 - A1c 5.4  Past Medical History:  Diagnosis Date   HFrEF (heart failure with reduced ejection fraction) (HCC)    Hyperlipidemia    Hypertensive heart disease    Nonischemic cardiomyopathy (HCC)     History reviewed. No pertinent surgical history.  Current Medications: Current Meds  Medication Sig   acetaminophen (TYLENOL) 500 MG tablet Take 500 mg by mouth every 6 (six) hours as needed.   amLODipine (NORVASC) 5 MG tablet Take 1 tablet (5 mg total) by mouth daily.   carvedilol (COREG) 25 MG tablet Take 1 tablet (25 mg total) by mouth 2 (two) times daily with a meal.   furosemide (LASIX) 20 MG tablet Take 1 tablet (20 mg total) by mouth daily.   rosuvastatin (CRESTOR) 20 MG tablet Take 1 tablet (20 mg total) by mouth daily.   sacubitril-valsartan (ENTRESTO) 97-103 MG Take 1 tablet by mouth 2 (two) times daily.    Allergies:   Patient has no known allergies.   Social History   Socioeconomic History   Marital status: Widowed    Spouse name: Not on file   Number of children: Not on file   Years of education: Not on file   Highest education level: Not on file  Occupational History   Not on file  Tobacco Use   Smoking status: Never   Smokeless tobacco: Never  Vaping Use   Vaping status: Never Used  Substance and Sexual Activity   Alcohol use: Yes    Comment: occasional   Drug use: Never   Sexual activity: Not on file  Other Topics Concern   Not on file  Social History Narrative   Not on file   Social Determinants of Health   Financial Resource Strain: Not on file  Food Insecurity:  Not on file  Transportation Needs: Not on file  Physical Activity: Not on file  Stress: Not on file  Social Connections: Not on file     Family History:  The patient's family history includes Breast cancer in her maternal grandmother and mother; Hyperlipidemia in her maternal grandmother and mother; Hypertension in her maternal grandmother and mother; Prostate cancer in her father.  ROS:   12-point review of systems is negative unless otherwise noted in the HPI.   EKGs/Labs/Other Studies Reviewed:    Studies reviewed were summarized above. The additional studies were reviewed today:  2D echo 03/24/2020: 1. Left ventricular ejection fraction, by estimation, is 60 to 65%. The  left ventricle has normal function. The left ventricle has no regional  wall motion abnormalities. Left ventricular diastolic parameters were  normal. The average left ventricular  global longitudinal strain is -15.9 %. The global longitudinal strain is  normal.   2. Right ventricular systolic function is normal.  The right ventricular  size is normal. There is normal pulmonary artery systolic pressure. The  estimated right ventricular systolic pressure is 31.5 mmHg.  __________   Eugenie Birks MPI 07/09/2019: Pharmacological myocardial perfusion imaging study with no significant  ischemia Normal wall motion, EF estimated at 51% Equivocal EKG changes , less than 1 mm at peak stress CT coronary calcium scoring with no significant aortic atherosclerosis or coronary calcification Baseline hypertension noted, systolic pressures 160s Low risk scan __________   2D echo 04/14/2019: 1. Left ventricular ejection fraction, by visual estimation, is 30 to  35%. The left ventricle has severely decreased function. There is  moderately increased left ventricular hypertrophy.   2. Severe hypokinesis of the left ventricular, entire anteroseptal wall.   3. Indeterminate diastolic filling due to E-A fusion.   4. Mildly dilated  left ventricular internal cavity size.   5. The left ventricle demonstrates global hypokinesis.   6. Global right ventricle has low normal systolic function.The right  ventricular size is moderately enlarged. No increase in right ventricular  wall thickness.   7. Left atrial size was mildly dilated.   8. Right atrial size was mildly dilated.   9. The pericardial effusion is anterior to the right ventricle.  10. Trivial pericardial effusion is present.  11. The mitral valve is normal in structure. Mild to moderate mitral valve  regurgitation.  12. The tricuspid valve is normal in structure.  13. The tricuspid valve is normal in structure. Tricuspid valve  regurgitation is mild-moderate.  14. The aortic valve is tricuspid. Aortic valve regurgitation is not  visualized. No evidence of aortic valve sclerosis or stenosis.  15. Pulmonic regurgitation is mild.  16. The pulmonic valve was abnormal. Pulmonic valve regurgitation is mild.  17. Moderately elevated pulmonary artery systolic pressure.  18. The inferior vena cava is normal in size with greater than 50%  respiratory variability, suggesting right atrial pressure of 3 mmHg.  19. The interatrial septum was not well visualized.   EKG:  EKG is not ordered today.    Recent Labs: 02/01/2022: Hemoglobin 14.2; Platelets 238; TSH 2.137 04/10/2022: ALT 29; BUN 14; Creatinine, Ser 0.80; Potassium 4.2; Sodium 137  Recent Lipid Panel    Component Value Date/Time   CHOL 155 04/10/2022 0834   CHOL 190 05/10/2020 0840   TRIG 199 (H) 04/10/2022 0834   HDL 34 (L) 04/10/2022 0834   HDL 46 05/10/2020 0840   CHOLHDL 4.6 04/10/2022 0834   VLDL 40 04/10/2022 0834   LDLCALC 81 04/10/2022 0834   LDLCALC 171 (H) 08/17/2021 1616   LDLDIRECT 189.0 10/06/2019 0911    PHYSICAL EXAM:    VS:  BP 130/78 (BP Location: Left Arm, Patient Position: Sitting, Cuff Size: Normal)   Pulse 75   Ht 5\' 1"  (1.549 m)   Wt 190 lb (86.2 kg)   LMP 08/26/2018   SpO2  99%   BMI 35.90 kg/m   BMI: Body mass index is 35.9 kg/m.  Physical Exam Vitals reviewed.  Constitutional:      Appearance: She is well-developed.  HENT:     Head: Normocephalic and atraumatic.  Eyes:     General:        Right eye: No discharge.        Left eye: No discharge.  Neck:     Vascular: No JVD.  Cardiovascular:     Rate and Rhythm: Normal rate and regular rhythm.     Heart sounds: Normal heart sounds, S1 normal and S2  normal. Heart sounds not distant. No midsystolic click and no opening snap. No murmur heard.    No friction rub.  Pulmonary:     Effort: Pulmonary effort is normal. No respiratory distress.     Breath sounds: Normal breath sounds. No decreased breath sounds, wheezing or rales.  Chest:     Chest wall: No tenderness.  Abdominal:     General: There is no distension.  Musculoskeletal:     Cervical back: Normal range of motion.  Skin:    General: Skin is warm and dry.     Nails: There is no clubbing.  Neurological:     Mental Status: She is alert and oriented to person, place, and time.  Psychiatric:        Speech: Speech normal.        Behavior: Behavior normal.        Thought Content: Thought content normal.        Judgment: Judgment normal.     Wt Readings from Last 3 Encounters:  10/15/22 190 lb (86.2 kg)  08/06/22 187 lb 9.6 oz (85.1 kg)  05/14/22 194 lb (88 kg)     ASSESSMENT & PLAN:   HFrEF secondary to presumed NICM: Euvolemic and well compensated with NYHA class I symptoms.  Most recent echo showed normalization of LV systolic function.  Continue carvedilol 25 mg twice daily and Entresto 97/103 mg twice daily along with furosemide 20 mg daily.  Recent renal function and electrolytes stable.  With resolution of cardiomyopathy, and in the setting of her being asymptomatic, defer further escalation of GDMT at this time.  Hypertensive heart disease: Blood pressure is much improved.  Continue current medical therapy including amlodipine 5 mg  and Entresto 97/100 mg twice daily.  Continued lifestyle modification and low-sodium diet is encouraged.  HLD: LDL 81 in 03/2022.  She remains on rosuvastatin milligram daily.    Disposition: F/u with Dr. Mariah Milling or an APP in 6 months.   Medication Adjustments/Labs and Tests Ordered: Current medicines are reviewed at length with the patient today.  Concerns regarding medicines are outlined above. Medication changes, Labs and Tests ordered today are summarized above and listed in the Patient Instructions accessible in Encounters.   Signed, Eula Listen, PA-C 10/15/2022 9:40 AM     [facility] [place] [address] [place], [place] [address] [phone]

## 2022-10-15 NOTE — Patient Instructions (Signed)
Medication Instructions:  No changes at this time.   *If you need a refill on your cardiac medications before your next appointment, please call your pharmacy*   Lab Work: None  If you have labs (blood work) drawn today and your tests are completely normal, you will receive your results only by: MyChart Message (if you have MyChart) OR A paper copy in the mail If you have any lab test that is abnormal or we need to change your treatment, we will call you to review the results.   Testing/Procedures: None   Follow-Up: At [facility], you and your health needs are our priority.  As part of our continuing mission to provide you with exceptional heart care, we have created designated Provider Care Teams.  These Care Teams include your primary Cardiologist (physician) and Advanced Practice Providers (APPs -  Physician Assistants and Nurse Practitioners) who all work together to provide you with the care you need, when you need it.  Your next appointment:   6 month(s)  Provider:   Timothy Gollan, MD or Ryan Dunn, PA-C     

## 2023-04-20 ENCOUNTER — Other Ambulatory Visit: Payer: Self-pay | Admitting: Cardiovascular Disease

## 2023-04-20 ENCOUNTER — Other Ambulatory Visit: Payer: Self-pay | Admitting: Physician Assistant

## 2023-04-22 ENCOUNTER — Other Ambulatory Visit: Payer: Self-pay | Admitting: Cardiovascular Disease

## 2023-04-22 MED ORDER — CARVEDILOL 25 MG PO TABS: 25.0000 mg | ORAL_TABLET | Freq: Two times a day (BID) | ORAL | 0 refills | Status: DC

## 2023-04-22 MED ORDER — FUROSEMIDE 20 MG PO TABS: 20.0000 mg | ORAL_TABLET | Freq: Every day | ORAL | 0 refills | Status: DC

## 2023-04-22 MED ORDER — ENTRESTO 97-103 MG PO TABS: 1.0000 | ORAL_TABLET | Freq: Two times a day (BID) | ORAL | 0 refills | Status: DC

## 2023-04-22 NOTE — Telephone Encounter (Signed)
I refilled everything except patients crestor.  Looks like she is requesting Crestor 10mg  and her chart states Crestor 20mg .

## 2023-04-22 NOTE — Telephone Encounter (Signed)
Last office visit: 10/15/22 with plan to f/u 6 months. next office visit: 05/17/23

## 2023-04-26 ENCOUNTER — Telehealth: Payer: Self-pay | Admitting: Pharmacy Technician

## 2023-04-26 ENCOUNTER — Other Ambulatory Visit (HOSPITAL_COMMUNITY): Payer: Self-pay

## 2023-04-26 ENCOUNTER — Encounter: Payer: Self-pay | Admitting: Pharmacy Technician

## 2023-04-26 NOTE — Telephone Encounter (Signed)
Pharmacy Patient Advocate Encounter  Received notification from [facility] that Prior Authorization for Entresto 97-103MG  tablets has been APPROVED from 04/26/23 to 04/25/24. Spoke to pharmacy to process.Copay is $175.00 for 30 days.   After seeing the cost, I got her a coupon card and called her pharmacy to provide them the information. The first is a 30 day free trial and the second offer should make her copay 10.00. I tried to call the patient but she has an automated system that answers and it denied my call. The pharmacy ran the free now and they have the 10.00 coupon on file for next time. Patient may need to remind them to run that coupon on her on next fill. I sent the patient a mychart to let them know.  PA #/Case ID/Reference #: [id number]

## 2023-04-26 NOTE — Telephone Encounter (Signed)
After seeing the cost, I got her a coupon card and called her pharmacy to provide them the information. The first is a 30 day free trial and the second offer should make her copay 10.00. I tried to call the patient but she has an automated system that answers and it denied my call. The pharmacy ran the free now and they have the 10.00 coupon on file for next time. Patient may need to remind them to run that coupon on her on next fill. I sent the patient a mychart to let them know.   PA #/Case ID/Reference #: [id number]

## 2023-04-26 NOTE — Telephone Encounter (Signed)
Pharmacy Patient Advocate Encounter   Received notification from Fax that prior authorization for Entresto 97-103MG  tablets is required/requested.   Insurance verification completed.   The patient is insured through [facility] .   Per test claim: PA required; PA submitted to above mentioned insurance via CoverMyMeds Key/confirmation #/EOC [id number] Status is pending

## 2023-05-17 ENCOUNTER — Ambulatory Visit: Payer: 59 | Attending: Physician Assistant | Admitting: Physician Assistant

## 2023-05-17 ENCOUNTER — Encounter: Payer: Self-pay | Admitting: Physician Assistant

## 2023-05-17 VITALS — BP 119/78 | HR 78 | Ht 61.0 in | Wt 199.4 lb

## 2023-05-17 DIAGNOSIS — E782 Mixed hyperlipidemia: Secondary | ICD-10-CM

## 2023-05-17 DIAGNOSIS — I428 Other cardiomyopathies: Secondary | ICD-10-CM

## 2023-05-17 DIAGNOSIS — Z79899 Other long term (current) drug therapy: Secondary | ICD-10-CM

## 2023-05-17 DIAGNOSIS — E66812 Obesity, class 2: Secondary | ICD-10-CM

## 2023-05-17 DIAGNOSIS — I5022 Chronic systolic (congestive) heart failure: Secondary | ICD-10-CM | POA: Diagnosis not present

## 2023-05-17 DIAGNOSIS — I110 Hypertensive heart disease with heart failure: Secondary | ICD-10-CM

## 2023-05-17 DIAGNOSIS — Z6837 Body mass index (BMI) 37.0-37.9, adult: Secondary | ICD-10-CM

## 2023-05-17 MED ORDER — SEMAGLUTIDE-WEIGHT MANAGEMENT 0.25 MG/0.5ML ~~LOC~~ SOAJ: 0.2500 mg | SUBCUTANEOUS | 0 refills | Status: DC

## 2023-05-17 NOTE — Patient Instructions (Signed)
Medication Instructions:  Your physician recommends the following medication changes.  START TAKING: Wegovy 0.25 mg injection once per week for 4 weeks then call us at week 3 for further refills if medicine is tolerated  *If you need a refill on your cardiac medications before your next appointment, please call your pharmacy*   Lab Work: Your provider would like for you to have following labs drawn today BMeT.   If you have labs (blood work) drawn today and your tests are completely normal, you will receive your results only by: MyChart Message (if you have MyChart) OR A paper copy in the mail If you have any lab test that is abnormal or we need to change your treatment, we will call you to review the results.   Follow-Up: At [facility], you and your health needs are our priority.  As part of our continuing mission to provide you with exceptional heart care, we have created designated Provider Care Teams.  These Care Teams include your primary Cardiologist (physician) and Advanced Practice Providers (APPs -  Physician Assistants and Nurse Practitioners) who all work together to provide you with the care you need, when you need it.   Your next appointment:   6 month(s)  Provider:   You may see Julien Nordmann, MD or one of the following Advanced Practice Providers on your designated Care Team:   Eula Listen, [place]

## 2023-05-17 NOTE — Progress Notes (Signed)
Cardiology Office Note    Date:  05/17/2023   ID:  Blythe, Veach December 15, 1965, MRN [id number]  PCP:  Lorre Munroe, NP  Cardiologist:  Julien Nordmann, MD  Electrophysiologist:  None   Chief Complaint: Follow-up  History of Present Illness:   Rebecca Bennett is a 58 y.o. 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   Echo in 03/2019 demonstrated an EF of 30 to 35%, moderate LVH, severe hypokinesis of the entire anteroseptal wall, indeterminate diastolic filling, mildly dilated LV internal cavity size, low normal RV systolic function with moderately enlarged ventricular cavity size, mild biatrial enlargement, trivial pericardial effusion anterior to the right ventricle, mild to moderate mitral regurgitation, mild to moderate tricuspid regurgitation, mild pulmonic regurgitation, moderately elevated PASP estimated at 47.6 mmHg, and an estimated right atrial pressure of 3 mmHg.  Given cardiomyopathy, she underwent Lexiscan MPI, at the request of her primary cardiologist, which showed no significant ischemia with an EF of 51% with no significant coronary calcification or aortic atherosclerosis noted on CT imaging.  Overall, this was a low risk scan.  Most recent echo from 02/2020 demonstrated an EF of 60 to 65%, no regional wall motion abnormalities, normal LV diastolic function parameters, normal RV systolic function and ventricular cavity size, and normal PASP.  She was seen in the office on 02/01/2022 and was without symptoms of angina or decompensation.  She had been out of her medications for approximately 3 months and consumed a diet high in salt.  Blood pressure was significantly elevated in the office with an initial reading of 200/114 subsequently improved to 178/108  following 2 doses of clonidine 0.1 mg.  She was started on carvedilol and losartan with recommendation for close follow-up.  She was seen in the office in 02/2022 and remained without symptoms of angina or decompensation.  She was tolerating and adherent to medications.  Blood pressure remained elevated at 181/103.  She did continue to consume salt, though was trying to decrease this.  Carvedilol was titrated to 25 mg twice daily and losartan to 100 mg daily.  She was seen in the office in 03/2022 with blood pressure was elevated in the office at 160/100, though she had not yet taken her medications that morning.  She was working on a heart healthy diet with her weight noted to be down 5 pounds by her scale.  She was working on minimizing salt intake as well.  She was transitioned from losartan to [facility] 97/23 mg twice daily with continuation of carvedilol 25 mg twice daily and furosemide 20 mg daily.  She was seen in 04/2022 and had undergone significant lifestyle modification.  Her weight was down 8 pounds when compared to her visit a month prior and a total of 13 pounds by her home scale.  Blood pressure was 180/110, though she had not yet taken her medications.  Given this escalation of GDMT was deferred.  She was seen in the office in 07/2022 with blood pressure at home typically in the 160s over 80s.  Her weight was down another 7 pounds when compared to her visit in 04/2022.  She was adherent and tolerating medical therapy.  Blood pressure 148/98 in the office.  Amlodipine 5 mg daily was added with continuation of carvedilol and Entresto.  She was last  seen in the office in 09/2022 and remained without symptoms of angina or cardiac decompensation.  Blood pressure readings had been in the 130s mmHg systolic.  No changes in pharmacotherapy were indicated at that time.  She comes in doing well from a cardiac perspective and is without symptoms of angina or cardiac decompensation.  Since she was last seen she  reports having 2 episodes of self-limiting tachypalpitations without associated symptoms.  No recent palpitations.  No lower extremity swelling.  She is trying to watch her sodium intake.  Adherent and tolerating cardiac medications without off target effect.  Blood pressure at home is typically in the 130s systolic.  Interested in GLP-1 therapy.  No personal or family history of medullary thyroid cancer or MEN-2.   Labs independently reviewed: 03/2022 - potassium 4.2, BUN 14, serum creatinine 0.8, albumin 4.2, AST/ALT normal, TC 155, TG 199, HDL 34, LDL 81 01/2022 - Hgb 14.2, PLT 238, TSH normal 07/2021 - A1c 5.4  Past Medical History:  Diagnosis Date   HFrEF (heart failure with reduced ejection fraction) (HCC)    Hyperlipidemia    Hypertensive heart disease    Nonischemic cardiomyopathy (HCC)     No past surgical history on file.  Current Medications: Current Meds  Medication Sig   acetaminophen (TYLENOL) 500 MG tablet Take 500 mg by mouth every 6 (six) hours as needed.   carvedilol (COREG) 25 MG tablet Take 1 tablet (25 mg total) by mouth 2 (two) times daily with a meal.   furosemide (LASIX) 20 MG tablet Take 1 tablet (20 mg total) by mouth daily.   rosuvastatin (CRESTOR) 10 MG tablet TAKE 1 TABLET(10 MG) BY MOUTH DAILY   rosuvastatin (CRESTOR) 20 MG tablet Take 1 tablet (20 mg total) by mouth daily.   sacubitril-valsartan (ENTRESTO) 97-103 MG Take 1 tablet by mouth 2 (two) times daily.    Allergies:   Patient has no known allergies.   Social History   Socioeconomic History   Marital status: Widowed    Spouse name: Not on file   Number of children: Not on file   Years of education: Not on file   Highest education level: Not on file  Occupational History   Not on file  Tobacco Use   Smoking status: Never   Smokeless tobacco: Never  Vaping Use   Vaping status: Never Used  Substance and Sexual Activity   Alcohol use: Yes    Comment: occasional   Drug use: Never    Sexual activity: Not on file  Other Topics Concern   Not on file  Social History Narrative   Not on file   Social Drivers of Health   Financial Resource Strain: Not on file  Food Insecurity: Not on file  Transportation Needs: Not on file  Physical Activity: Not on file  Stress: Not on file  Social Connections: Not on file     Family History:  The patient's family history includes Breast cancer in her maternal grandmother and mother; Hyperlipidemia in her maternal grandmother and mother; Hypertension in her maternal grandmother and mother; Prostate cancer in her father.  ROS:   12-point review of systems is negative unless otherwise noted in the HPI.   EKGs/Labs/Other Studies Reviewed:    Studies reviewed were summarized above. The additional studies were reviewed today:  2D echo 03/24/2020: 1. Left ventricular ejection fraction, by estimation, is 60 to 65%. The  left ventricle has normal function. The left ventricle has no regional  wall motion abnormalities.  Left ventricular diastolic parameters were  normal. The average left ventricular  global longitudinal strain is -15.9 %. The global longitudinal strain is  normal.   2. Right ventricular systolic function is normal. The right ventricular  size is normal. There is normal pulmonary artery systolic pressure. The  estimated right ventricular systolic pressure is 31.5 mmHg.  __________   Eugenie Birks MPI 07/09/2019: Pharmacological myocardial perfusion imaging study with no significant  ischemia Normal wall motion, EF estimated at 51% Equivocal EKG changes , less than 1 mm at peak stress CT coronary calcium scoring with no significant aortic atherosclerosis or coronary calcification Baseline hypertension noted, systolic pressures 160s Low risk scan __________   2D echo 04/14/2019: 1. Left ventricular ejection fraction, by visual estimation, is 30 to  35%. The left ventricle has severely decreased function. There is   moderately increased left ventricular hypertrophy.   2. Severe hypokinesis of the left ventricular, entire anteroseptal wall.   3. Indeterminate diastolic filling due to E-A fusion.   4. Mildly dilated left ventricular internal cavity size.   5. The left ventricle demonstrates global hypokinesis.   6. Global right ventricle has low normal systolic function.The right  ventricular size is moderately enlarged. No increase in right ventricular  wall thickness.   7. Left atrial size was mildly dilated.   8. Right atrial size was mildly dilated.   9. The pericardial effusion is anterior to the right ventricle.  10. Trivial pericardial effusion is present.  11. The mitral valve is normal in structure. Mild to moderate mitral valve  regurgitation.  12. The tricuspid valve is normal in structure.  13. The tricuspid valve is normal in structure. Tricuspid valve  regurgitation is mild-moderate.  14. The aortic valve is tricuspid. Aortic valve regurgitation is not  visualized. No evidence of aortic valve sclerosis or stenosis.  15. Pulmonic regurgitation is mild.  16. The pulmonic valve was abnormal. Pulmonic valve regurgitation is mild.  17. Moderately elevated pulmonary artery systolic pressure.  18. The inferior vena cava is normal in size with greater than 50%  respiratory variability, suggesting right atrial pressure of 3 mmHg.  19. The interatrial septum was not well visualized.   EKG:  EKG is ordered today.  The EKG ordered today demonstrates NSR, 78 bpm, no acute ST-T changes  Recent Labs: No results found for requested labs within last 365 days.  Recent Lipid Panel    Component Value Date/Time   CHOL 155 04/10/2022 0834   CHOL 190 05/10/2020 0840   TRIG 199 (H) 04/10/2022 0834   HDL 34 (L) 04/10/2022 0834   HDL 46 05/10/2020 0840   CHOLHDL 4.6 04/10/2022 0834   VLDL 40 04/10/2022 0834   LDLCALC 81 04/10/2022 0834   LDLCALC 171 (H) 08/17/2021 1616   LDLDIRECT 189.0 10/06/2019  0911    PHYSICAL EXAM:    VS:  BP 119/78 (BP Location: Left Arm, Patient Position: Sitting, Cuff Size: Normal)   Pulse 78   Ht 5\' 1"  (1.549 m)   Wt 199 lb 6.4 oz (90.4 kg)   LMP 08/26/2018   SpO2 97%   BMI 37.68 kg/m   BMI: Body mass index is 37.68 kg/m.  Physical Exam Vitals reviewed.  Constitutional:      Appearance: She is well-developed.  HENT:     Head: Normocephalic and atraumatic.  Eyes:     General:        Right eye: No discharge.        Left eye: No  discharge.  Neck:     Vascular: No JVD.  Cardiovascular:     Rate and Rhythm: Normal rate and regular rhythm.     Pulses:          Posterior tibial pulses are 2+ on the right side and 2+ on the left side.     Heart sounds: Normal heart sounds, S1 normal and S2 normal. Heart sounds not distant. No midsystolic click and no opening snap. No murmur heard.    No friction rub.  Pulmonary:     Effort: Pulmonary effort is normal. No respiratory distress.     Breath sounds: Normal breath sounds. No decreased breath sounds, wheezing, rhonchi or rales.  Chest:     Chest wall: No tenderness.  Abdominal:     General: There is no distension.     Palpations: Abdomen is soft.     Tenderness: There is no abdominal tenderness.  Musculoskeletal:     Cervical back: Normal range of motion.     Right lower leg: No edema.     Left lower leg: No edema.  Skin:    General: Skin is warm and dry.     Nails: There is no clubbing.  Neurological:     Mental Status: She is alert and oriented to person, place, and time.  Psychiatric:        Speech: Speech normal.        Behavior: Behavior normal.        Thought Content: Thought content normal.        Judgment: Judgment normal.     Wt Readings from Last 3 Encounters:  05/17/23 199 lb 6.4 oz (90.4 kg)  10/15/22 190 lb (86.2 kg)  08/06/22 187 lb 9.6 oz (85.1 kg)     ASSESSMENT & PLAN:   HFrEF secondary to presumed NICM: Euvolemic and well compensated with NYHA class I symptoms.  Most recent echo showed normalization of LV systolic function. Continue carvedilol 25 mg twice daily and Entresto 97/103 mg twice daily along with furosemide 20 mg daily.  Check BMP.  With resolution of cardiomyopathy, in the setting of her being asymptomatic, defer further escalation of GDMT at this time.  Hypertensive heart disease: Blood pressure is well-controlled in the office today.  Remains on amlodipine 5 mg, carvedilol 25 mg twice daily, and Entresto 97/23 mg twice daily.  Low-sodium diet recommended.  HLD: LDL 81 in 03/2022.  Remains on rosuvastatin 20 mg.  Obesity: Trial of Wegovy 0.25 mg weekly.  No personal or family history of medullary thyroid cancer or multiple endocrine neoplasia type II.    Disposition: F/u with Dr. Mariah Milling or an APP in 6 months.   Medication Adjustments/Labs and Tests Ordered: Current medicines are reviewed at length with the patient today.  Concerns regarding medicines are outlined above. Medication changes, Labs and Tests ordered today are summarized above and listed in the Patient Instructions accessible in Encounters.   Signed, Eula Listen, PA-C 05/17/2023 8:10 AM      [facility] [address] [place], [place] [address] [phone]

## 2023-05-18 LAB — BASIC METABOLIC PANEL
BUN/Creatinine Ratio: 20 (ref 9–23)
BUN: 19 mg/dL (ref 6–24)
CO2: 24 mmol/L (ref 20–29)
Calcium: 9.7 mg/dL (ref 8.7–10.2)
Chloride: 101 mmol/L (ref 96–106)
Creatinine, Ser: 0.97 mg/dL (ref 0.57–1.00)
Glucose: 102 mg/dL — ABNORMAL HIGH (ref 70–99)
Potassium: 4.3 mmol/L (ref 3.5–5.2)
Sodium: 142 mmol/L (ref 134–144)
eGFR: 69 mL/min/{1.73_m2} (ref >59)

## 2023-05-22 ENCOUNTER — Other Ambulatory Visit (HOSPITAL_COMMUNITY): Payer: Self-pay

## 2023-05-22 ENCOUNTER — Telehealth: Payer: Self-pay | Admitting: Pharmacy Technician

## 2023-05-22 NOTE — Telephone Encounter (Signed)
 Pharmacy Patient Advocate Encounter   Received notification from Fax that prior authorization for Wegovy 0.25MG /0.5ML auto-injectors is required/requested.   Insurance verification completed.   The patient is insured through [facility] .   Per test claim: PA required; PA submitted to above mentioned insurance via CoverMyMeds Key/confirmation #/EOC [id number] Status is pending

## 2023-05-22 NOTE — Telephone Encounter (Signed)
 Pharmacy Patient Advocate Encounter  Received notification from [facility] that Prior Authorization for wegovy has been APPROVED from 05/21/23 to 09/19/23. Ran test claim, Copay is $24.99. This test claim was processed through [facility]- copay amounts may vary at other pharmacies due to pharmacy/plan contracts, or as the patient moves through the different stages of their insurance plan.   PA #/Case ID/Reference #: [id number]

## 2023-06-21 ENCOUNTER — Other Ambulatory Visit: Payer: Self-pay | Admitting: Emergency Medicine

## 2023-06-21 MED ORDER — SEMAGLUTIDE-WEIGHT MANAGEMENT 1 MG/0.5ML ~~LOC~~ SOAJ: 1.0000 mg | SUBCUTANEOUS | 0 refills | Status: AC

## 2023-06-21 MED ORDER — SEMAGLUTIDE-WEIGHT MANAGEMENT 0.5 MG/0.5ML ~~LOC~~ SOAJ: 0.5000 mg | SUBCUTANEOUS | 0 refills | Status: AC

## 2023-07-17 ENCOUNTER — Other Ambulatory Visit: Payer: Self-pay | Admitting: Cardiovascular Disease

## 2023-08-16 ENCOUNTER — Other Ambulatory Visit: Payer: Self-pay | Admitting: Emergency Medicine

## 2023-08-16 MED ORDER — WEGOVY 1.7 MG/0.75ML ~~LOC~~ SOAJ: 1.7000 mg | SUBCUTANEOUS | 3 refills | Status: AC

## 2023-08-19 ENCOUNTER — Other Ambulatory Visit: Payer: Self-pay | Admitting: Physician Assistant

## 2023-10-10 ENCOUNTER — Other Ambulatory Visit (HOSPITAL_COMMUNITY): Payer: Self-pay

## 2023-10-10 ENCOUNTER — Telehealth: Payer: Self-pay | Admitting: Pharmacy Technician

## 2023-10-10 NOTE — Telephone Encounter (Signed)
 Pharmacy Patient Advocate Encounter   Received notification from Fax that prior authorization for Wegovy  1.7 MG is required/requested.   Insurance verification completed.   The patient is insured through [facility] .   Per test claim: PA required; PA submitted to above mentioned insurance via CoverMyMeds Key/confirmation #/EOC [facility] Status is pending

## 2023-10-11 ENCOUNTER — Other Ambulatory Visit (HOSPITAL_COMMUNITY): Payer: Self-pay

## 2023-10-11 NOTE — Telephone Encounter (Signed)
 Pharmacy Patient Advocate Encounter  Received notification from [employer] that Prior Authorization for Wegovy  1.7 has been APPROVED from 10/11/23 to 10/10/24. Ran test claim, Copay is $24.99*- one month. This test claim was processed through [facility]- copay amounts may vary at other pharmacies due to pharmacy/plan contracts, or as the patient moves through the different stages of their insurance plan.   PA #/Case ID/Reference #: [id number]

## 2023-10-29 ENCOUNTER — Other Ambulatory Visit: Payer: Self-pay | Admitting: Cardiovascular Disease

## 2023-11-26 ENCOUNTER — Other Ambulatory Visit: Payer: Self-pay | Admitting: Cardiovascular Disease

## 2023-11-27 ENCOUNTER — Other Ambulatory Visit: Payer: Self-pay | Admitting: Cardiovascular Disease

## 2023-12-01 NOTE — Progress Notes (Unsigned)
 Cardiology Office Note    Date:  12/02/2023   ID:  Rebecca Bennett, DOB [birth year], MRN [id number]  PCP:  Antonette Angeline ORN, NP  Cardiologist:  Evalene Lunger, MD  Electrophysiologist:  None   Chief Complaint: Follow up  History of Present Illness:   Rebecca Bennett is a 58 y.o. 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   Echo in 03/2019 demonstrated an EF of 30 to 35%, moderate LVH, severe hypokinesis of the entire anteroseptal wall, indeterminate diastolic filling, mildly dilated LV internal cavity size, low normal RV systolic function with moderately enlarged ventricular cavity size, mild biatrial enlargement, trivial pericardial effusion anterior to the right ventricle, mild to moderate mitral regurgitation, mild to moderate tricuspid regurgitation, mild pulmonic regurgitation, moderately elevated PASP estimated at 47.6 mmHg, and an estimated right atrial pressure of 3 mmHg.  She underwent Lexiscan  MPI, at the request of her primary cardiologist, which showed no significant ischemia with an EF of 51% with no significant coronary calcification or aortic atherosclerosis noted on CT imaging.  Overall, this was a low risk scan.  Most recent echo from 02/2020 demonstrated an EF of 60 to 65%, no regional wall motion abnormalities, normal LV diastolic function parameters, normal RV systolic function and ventricular cavity size, and normal PASP.  She was seen in the office on 02/01/2022 and had been out of her medications for approximately 3 months and consumed a diet high in salt.  Blood pressure was significantly elevated in the office with an initial reading of 200/114.  She was started on carvedilol  and losartan  with recommendation for close follow-up.  Escalation of GDMT and  hypertensive pharmacotherapy has been undertaken in follow-up, along with significant lifestyle modification.  She was last seen in the office in 04/2023 and reported blood pressure typically in the 130s mmHg systolic.  She was continued on carvedilol  25 mg twice daily, Entresto  97/23 mg twice daily, and amlodipine  5 mg.  She has tolerated GLP-1 therapy.  She comes in doing very well from a cardiac perspective and is without symptoms of angina or cardiac decompensation.  No dizziness, presyncope, or syncope.  No falls.  Blood pressure is typically well-controlled at home, however she has been without carvedilol  or Entresto  over the past several days.  Just got these refilled.  Continues to follow a low-sodium diet.  Adherent and tolerating GLP-1 therapy without off target effect, would like to titrate dose.  Otherwise, does not have any acute concerns at this time.   Labs independently reviewed: 04/2023 - BUN 19, serum creatinine 0.97, potassium 4.3 03/2022 - albumin 4.2, AST/ALT normal, TC 155, TG 199, HDL 34, LDL 81 01/2022 - Hgb 14.2, PLT 238, TSH normal 07/2021 - A1c 5.4  Past Medical History:  Diagnosis Date   HFrEF (heart failure with reduced ejection fraction) (HCC)    Hyperlipidemia    Hypertensive heart disease    Nonischemic cardiomyopathy (HCC)     No past surgical history on file.  Current Medications: Current Meds  Medication Sig   acetaminophen (TYLENOL) 500 MG tablet Take 500 mg by mouth every 6 (six) hours as needed.   amLODipine  (NORVASC ) 5 MG tablet TAKE 1 TABLET(5 MG) BY MOUTH DAILY   carvedilol  (COREG ) 25 MG tablet TAKE 1 TABLET(25 MG) BY MOUTH TWICE DAILY WITH A MEAL  furosemide  (LASIX ) 20 MG tablet TAKE 1 TABLET(20 MG) BY MOUTH DAILY   rosuvastatin  (CRESTOR ) 10 MG tablet TAKE 1 TABLET(10 MG) BY MOUTH DAILY   rosuvastatin  (CRESTOR ) 20 MG tablet Take 1 tablet (20 mg total) by mouth daily.   sacubitril -valsartan  (ENTRESTO ) 97-103 MG TAKE 1 TABLET BY MOUTH TWICE DAILY    Semaglutide -Weight Management (WEGOVY ) 1.7 MG/0.75ML SOAJ Inject 1.7 mg into the skin once a week.   semaglutide -weight management (WEGOVY ) 2.4 MG/0.75ML SOAJ SQ injection Inject 2.4 mg into the skin once a week.   [DISCONTINUED] Semaglutide -Weight Management 0.25 MG/0.5ML SOAJ Inject 0.25 mg into the skin once a week.    Allergies:   Patient has no known allergies.   Social History   Socioeconomic History   Marital status: Widowed    Spouse name: Not on file   Number of children: Not on file   Years of education: Not on file   Highest education level: Not on file  Occupational History   Not on file  Tobacco Use   Smoking status: Never   Smokeless tobacco: Never  Vaping Use   Vaping status: Never Used  Substance and Sexual Activity   Alcohol use: Yes    Comment: occasional   Drug use: Never   Sexual activity: Not on file  Other Topics Concern   Not on file  Social History Narrative   Not on file   Social Drivers of Health   Financial Resource Strain: Not on file  Food Insecurity: Not on file  Transportation Needs: Not on file  Physical Activity: Not on file  Stress: Not on file  Social Connections: Not on file     Family History:  The patient's family history includes Breast cancer in her maternal grandmother and mother; Hyperlipidemia in her maternal grandmother and mother; Hypertension in her maternal grandmother and mother; Prostate cancer in her father.  ROS:   12-point review of systems is negative unless otherwise noted in the HPI.   EKGs/Labs/Other Studies Reviewed:    Studies reviewed were summarized above. The additional studies were reviewed today:  2D echo 03/24/2020: 1. Left ventricular ejection fraction, by estimation, is 60 to 65%. The  left ventricle has normal function. The left ventricle has no regional  wall motion abnormalities. Left ventricular diastolic parameters were  normal. The average left ventricular  global longitudinal strain is  -15.9 %. The global longitudinal strain is  normal.   2. Right ventricular systolic function is normal. The right ventricular  size is normal. There is normal pulmonary artery systolic pressure. The  estimated right ventricular systolic pressure is 31.5 mmHg.  __________   Lexiscan  MPI 07/09/2019: Pharmacological myocardial perfusion imaging study with no significant  ischemia Normal wall motion, EF estimated at 51% Equivocal EKG changes , less than 1 mm at peak stress CT coronary calcium  scoring with no significant aortic atherosclerosis or coronary calcification Baseline hypertension noted, systolic pressures 160s Low risk scan __________   2D echo 04/14/2019: 1. Left ventricular ejection fraction, by visual estimation, is 30 to  35%. The left ventricle has severely decreased function. There is  moderately increased left ventricular hypertrophy.   2. Severe hypokinesis of the left ventricular, entire anteroseptal wall.   3. Indeterminate diastolic filling due to E-A fusion.   4. Mildly dilated left ventricular internal cavity size.   5. The left ventricle demonstrates global hypokinesis.   6. Global right ventricle has low normal systolic function.The right  ventricular size is moderately enlarged. No increase in  right ventricular  wall thickness.   7. Left atrial size was mildly dilated.   8. Right atrial size was mildly dilated.   9. The pericardial effusion is anterior to the right ventricle.  10. Trivial pericardial effusion is present.  11. The mitral valve is normal in structure. Mild to moderate mitral valve  regurgitation.  12. The tricuspid valve is normal in structure.  13. The tricuspid valve is normal in structure. Tricuspid valve  regurgitation is mild-moderate.  14. The aortic valve is tricuspid. Aortic valve regurgitation is not  visualized. No evidence of aortic valve sclerosis or stenosis.  15. Pulmonic regurgitation is mild.  16. The pulmonic valve was  abnormal. Pulmonic valve regurgitation is mild.  17. Moderately elevated pulmonary artery systolic pressure.  18. The inferior vena cava is normal in size with greater than 50%  respiratory variability, suggesting right atrial pressure of 3 mmHg.  19. The interatrial septum was not well visualized.   EKG:  EKG is ordered today.  The EKG ordered today demonstrates NSR, 89 bpm, no acute ST-T changes  Recent Labs: 05/17/2023: BUN 19; Creatinine, Ser 0.97; Potassium 4.3; Sodium 142  Recent Lipid Panel    Component Value Date/Time   CHOL 155 04/10/2022 0834   CHOL 190 05/10/2020 0840   TRIG 199 (H) 04/10/2022 0834   HDL 34 (L) 04/10/2022 0834   HDL 46 05/10/2020 0840   CHOLHDL 4.6 04/10/2022 0834   VLDL 40 04/10/2022 0834   LDLCALC 81 04/10/2022 0834   LDLCALC 171 (H) 08/17/2021 1616   LDLDIRECT 189.0 10/06/2019 0911    PHYSICAL EXAM:    VS:  BP (!) 140/88 (BP Location: Left Arm, Patient Position: Sitting, Cuff Size: Normal)   Pulse 89   Ht 5' 1 (1.549 m)   Wt 180 lb 9.6 oz (81.9 kg)   LMP 08/26/2018   SpO2 98%   BMI 34.12 kg/m   BMI: Body mass index is 34.12 kg/m.  Physical Exam Vitals reviewed.  Constitutional:      Appearance: She is well-developed.  HENT:     Head: Normocephalic and atraumatic.  Eyes:     General:        Right eye: No discharge.        Left eye: No discharge.  Cardiovascular:     Rate and Rhythm: Normal rate and regular rhythm.     Heart sounds: Normal heart sounds, S1 normal and S2 normal. Heart sounds not distant. No midsystolic click and no opening snap. No murmur heard.    No friction rub.  Pulmonary:     Effort: Pulmonary effort is normal. No respiratory distress.     Breath sounds: Normal breath sounds. No decreased breath sounds, wheezing, rhonchi or rales.  Chest:     Chest wall: No tenderness.  Musculoskeletal:     Cervical back: Normal range of motion.     Right lower leg: No edema.     Left lower leg: No edema.  Skin:     General: Skin is warm and dry.     Nails: There is no clubbing.  Neurological:     Mental Status: She is alert and oriented to person, place, and time.  Psychiatric:        Speech: Speech normal.        Behavior: Behavior normal.        Thought Content: Thought content normal.        Judgment: Judgment normal.     Wt Readings from  Last 3 Encounters:  12/02/23 180 lb 9.6 oz (81.9 kg)  05/17/23 199 lb 6.4 oz (90.4 kg)  10/15/22 190 lb (86.2 kg)     ASSESSMENT & PLAN:   HFimpEF secondary to presumed NICM: Euvolemic and well compensated with NYHA class I symptoms.  Continue current GDMT including carvedilol  25 mg twice daily and Entresto  97/23 mg twice daily along with furosemide  20 mg daily.  Check CMP.  With resolution of cardiomyopathy in the setting of improvement in blood pressure, and with her being asymptomatic, defer further escalation of GDMT at this time.  Hypertensive heart disease: Blood pressure is mildly elevated in the office today, though she has been without Entresto  and carvedilol  for several days.  Continue current dose of amlodipine  5 mg, carvedilol  25 mg twice daily, and Entresto  97/103 mg twice daily.  Continue low-sodium diet.  HLD: LDL 81 in 03/2022.  She remains on rosuvastatin  20 mg.  Check lipid panel and LFT.  Obesity: Tolerating Wegovy  without off target effect.  No personal or family history of medullary thyroid  cancer or multiple endocrine neoplasia type II.  Titrate Wegovy  to 2.4 mg weekly.      Disposition: F/u with Dr. Gollan or an APP in 12 months.   Medication Adjustments/Labs and Tests Ordered: Current medicines are reviewed at length with the patient today.  Concerns regarding medicines are outlined above. Medication changes, Labs and Tests ordered today are summarized above and listed in the Patient Instructions accessible in Encounters.   Signed, Bernardino Bring, PA-C 12/02/2023 9:57 AM     [facility] [place] [address]  [address] [place], [place] [address] [phone]

## 2023-12-02 ENCOUNTER — Encounter: Payer: Self-pay | Admitting: Physician Assistant

## 2023-12-02 ENCOUNTER — Ambulatory Visit: Attending: Physician Assistant | Admitting: Physician Assistant

## 2023-12-02 VITALS — BP 140/88 | HR 89 | Ht 61.0 in | Wt 180.6 lb

## 2023-12-02 DIAGNOSIS — Z6834 Body mass index (BMI) 34.0-34.9, adult: Secondary | ICD-10-CM

## 2023-12-02 DIAGNOSIS — I428 Other cardiomyopathies: Secondary | ICD-10-CM | POA: Diagnosis not present

## 2023-12-02 DIAGNOSIS — I5022 Chronic systolic (congestive) heart failure: Secondary | ICD-10-CM

## 2023-12-02 DIAGNOSIS — Z6837 Body mass index (BMI) 37.0-37.9, adult: Secondary | ICD-10-CM

## 2023-12-02 DIAGNOSIS — I110 Hypertensive heart disease with heart failure: Secondary | ICD-10-CM | POA: Diagnosis not present

## 2023-12-02 DIAGNOSIS — E782 Mixed hyperlipidemia: Secondary | ICD-10-CM

## 2023-12-02 DIAGNOSIS — I5032 Chronic diastolic (congestive) heart failure: Secondary | ICD-10-CM | POA: Diagnosis not present

## 2023-12-02 DIAGNOSIS — E66811 Obesity, class 1: Secondary | ICD-10-CM

## 2023-12-02 DIAGNOSIS — Z79899 Other long term (current) drug therapy: Secondary | ICD-10-CM

## 2023-12-02 MED ORDER — WEGOVY 2.4 MG/0.75ML ~~LOC~~ SOAJ: 2.4000 mg | SUBCUTANEOUS | 12 refills | Status: AC

## 2023-12-02 NOTE — Patient Instructions (Addendum)
 Medication Instructions:  Your physician recommends the following medication changes.  INCREASE: Wegovy  2.4 mg SOAJ weekly injection   *If you need a refill on your cardiac medications before your next appointment, please call your pharmacy*  Lab Work: Your provider would like for you to have following labs drawn today CMeT, and Lipid panel.   If you have labs (blood work) drawn today and your tests are completely normal, you will receive your results only by: MyChart Message (if you have MyChart) OR A paper copy in the mail If you have any lab test that is abnormal or we need to change your treatment, we will call you to review the results.  Follow-Up: At [facility], you and your health needs are our priority.  As part of our continuing mission to provide you with exceptional heart care, our providers are all part of one team.  This team includes your primary Cardiologist (physician) and Advanced Practice Providers or APPs (Physician Assistants and Nurse Practitioners) who all work together to provide you with the care you need, when you need it.  Your next appointment:   1 year(s)  Provider:   You may see Timothy Gollan, MD or Bernardino Bring, PA-C  We recommend signing up for the patient portal called MyChart.  Sign up information is provided on this After Visit Summary.  MyChart is used to connect with patients for Virtual Visits (Telemedicine).  Patients are able to view lab/test results, encounter notes, upcoming appointments, etc.  Non-urgent messages can be sent to your provider as well.   To learn more about what you can do with MyChart, go to [email or web address].

## 2023-12-03 ENCOUNTER — Telehealth: Payer: Self-pay | Admitting: Pharmacy Technician

## 2023-12-03 ENCOUNTER — Ambulatory Visit: Payer: Self-pay | Admitting: Physician Assistant

## 2023-12-03 LAB — COMPREHENSIVE METABOLIC PANEL WITH GFR
ALT: 17 IU/L (ref 0–32)
AST: 19 IU/L (ref 0–40)
Albumin: 4.7 g/dL (ref 3.8–4.9)
Alkaline Phosphatase: 89 IU/L (ref 44–121)
BUN/Creatinine Ratio: 7 — ABNORMAL LOW (ref 9–23)
BUN: 5 mg/dL — ABNORMAL LOW (ref 6–24)
Bilirubin Total: 0.5 mg/dL (ref 0.0–1.2)
CO2: 23 mmol/L (ref 20–29)
Calcium: 9.7 mg/dL (ref 8.7–10.2)
Chloride: 102 mmol/L (ref 96–106)
Creatinine, Ser: 0.75 mg/dL (ref 0.57–1.00)
Globulin, Total: 2.3 g/dL (ref 1.5–4.5)
Glucose: 93 mg/dL (ref 70–99)
Potassium: 4.5 mmol/L (ref 3.5–5.2)
Sodium: 142 mmol/L (ref 134–144)
Total Protein: 7 g/dL (ref 6.0–8.5)
eGFR: 93 mL/min/1.73 (ref >59)

## 2023-12-03 LAB — LIPID PANEL
Chol/HDL Ratio: 4.5 ratio — ABNORMAL HIGH (ref 0.0–4.4)
Cholesterol, Total: 192 mg/dL (ref 100–199)
HDL: 43 mg/dL (ref >39)
LDL Chol Calc (NIH): 104 mg/dL — ABNORMAL HIGH (ref 0–99)
Triglycerides: 264 mg/dL — ABNORMAL HIGH (ref 0–149)
VLDL Cholesterol Cal: 45 mg/dL — ABNORMAL HIGH (ref 5–40)

## 2023-12-03 NOTE — Telephone Encounter (Signed)
   Pharmacy Patient Advocate Encounter   Received notification from Onbase that prior authorization for WEGOVY  2.4MG  is required/requested.   Insurance verification completed.   The patient is insured through [facility] .   Per test claim: PA required; PA submitted to above mentioned insurance via Latent Key/confirmation #/EOC [id number] Status is pending

## 2023-12-04 NOTE — Telephone Encounter (Signed)
 I called [facility] and it is now being filled

## 2023-12-04 NOTE — Telephone Encounter (Signed)
 Pharmacy Patient Advocate Encounter  Received notification from [employer] that Prior Authorization for wegovy  2.4mg  has been APPROVED from 12/04/23 to 12/17/23   PA #/Case ID/Reference #: [id number]
